# Patient Record
Sex: Female | Born: 1935 | Race: White | Hispanic: No | Smoking: Former smoker
Health system: Southern US, Community
[De-identification: ages and names within clinical notes are randomized; demographics above are authoritative.]

## PROBLEM LIST (undated history)

## (undated) DIAGNOSIS — B009 Herpesviral infection, unspecified: Secondary | ICD-10-CM

## (undated) DIAGNOSIS — G4700 Insomnia, unspecified: Secondary | ICD-10-CM

## (undated) DIAGNOSIS — T7840XA Allergy, unspecified, initial encounter: Secondary | ICD-10-CM

## (undated) DIAGNOSIS — E7800 Pure hypercholesterolemia, unspecified: Secondary | ICD-10-CM

## (undated) DIAGNOSIS — I10 Essential (primary) hypertension: Secondary | ICD-10-CM

## (undated) DIAGNOSIS — R739 Hyperglycemia, unspecified: Secondary | ICD-10-CM

## (undated) DIAGNOSIS — F419 Anxiety disorder, unspecified: Secondary | ICD-10-CM

## (undated) DIAGNOSIS — R7303 Prediabetes: Secondary | ICD-10-CM

## (undated) DIAGNOSIS — H3530 Unspecified macular degeneration: Secondary | ICD-10-CM

## (undated) HISTORY — DX: Insomnia, unspecified: G47.00

## (undated) HISTORY — DX: Allergy, unspecified, initial encounter: T78.40XA

## (undated) HISTORY — DX: Herpesviral infection, unspecified: B00.9

## (undated) HISTORY — DX: Hyperglycemia, unspecified: R73.9

## (undated) HISTORY — DX: Essential (primary) hypertension: I10

## (undated) HISTORY — DX: Anxiety disorder, unspecified: F41.9

## (undated) HISTORY — DX: Pure hypercholesterolemia, unspecified: E78.00

## (undated) HISTORY — PX: CATARACT EXTRACTION: SUR2

## (undated) HISTORY — PX: ABDOMINAL HYSTERECTOMY: SHX81

## (undated) HISTORY — PX: HEEL SPUR SURGERY: SHX665

---

## 1995-01-21 ENCOUNTER — Encounter: Payer: Self-pay | Admitting: Internal Medicine

## 1998-08-23 ENCOUNTER — Other Ambulatory Visit: Admission: RE | Admit: 1998-08-23 | Discharge: 1998-08-23 | Payer: Self-pay | Admitting: Obstetrics and Gynecology

## 1999-11-22 ENCOUNTER — Other Ambulatory Visit: Admission: RE | Admit: 1999-11-22 | Discharge: 1999-11-22 | Payer: Self-pay | Admitting: Obstetrics and Gynecology

## 1999-12-31 ENCOUNTER — Ambulatory Visit (HOSPITAL_COMMUNITY): Admission: RE | Admit: 1999-12-31 | Discharge: 1999-12-31 | Payer: Self-pay | Admitting: Gastroenterology

## 2000-11-27 ENCOUNTER — Ambulatory Visit (HOSPITAL_BASED_OUTPATIENT_CLINIC_OR_DEPARTMENT_OTHER): Admission: RE | Admit: 2000-11-27 | Discharge: 2000-11-27 | Payer: Self-pay | Admitting: Orthopedic Surgery

## 2000-12-01 ENCOUNTER — Ambulatory Visit (HOSPITAL_COMMUNITY): Admission: RE | Admit: 2000-12-01 | Discharge: 2000-12-01 | Payer: Self-pay | Admitting: Orthopedic Surgery

## 2000-12-02 ENCOUNTER — Ambulatory Visit (HOSPITAL_BASED_OUTPATIENT_CLINIC_OR_DEPARTMENT_OTHER): Admission: RE | Admit: 2000-12-02 | Discharge: 2000-12-02 | Payer: Self-pay | Admitting: Orthopedic Surgery

## 2001-01-01 ENCOUNTER — Other Ambulatory Visit: Admission: RE | Admit: 2001-01-01 | Discharge: 2001-01-01 | Payer: Self-pay | Admitting: Obstetrics and Gynecology

## 2002-05-19 ENCOUNTER — Ambulatory Visit (HOSPITAL_BASED_OUTPATIENT_CLINIC_OR_DEPARTMENT_OTHER): Admission: RE | Admit: 2002-05-19 | Discharge: 2002-05-19 | Payer: Self-pay | Admitting: Urology

## 2007-12-28 ENCOUNTER — Encounter: Admission: RE | Admit: 2007-12-28 | Discharge: 2007-12-28 | Payer: Self-pay | Admitting: Orthopedic Surgery

## 2007-12-31 ENCOUNTER — Ambulatory Visit (HOSPITAL_BASED_OUTPATIENT_CLINIC_OR_DEPARTMENT_OTHER): Admission: RE | Admit: 2007-12-31 | Discharge: 2007-12-31 | Payer: Self-pay | Admitting: Orthopedic Surgery

## 2008-09-04 ENCOUNTER — Encounter: Payer: Self-pay | Admitting: Internal Medicine

## 2008-09-14 ENCOUNTER — Encounter: Payer: Self-pay | Admitting: Internal Medicine

## 2008-11-14 ENCOUNTER — Encounter: Payer: Self-pay | Admitting: Emergency Medicine

## 2008-11-14 ENCOUNTER — Inpatient Hospital Stay (HOSPITAL_COMMUNITY): Admission: AD | Admit: 2008-11-14 | Discharge: 2008-11-18 | Payer: Self-pay | Admitting: Internal Medicine

## 2008-11-14 ENCOUNTER — Ambulatory Visit: Payer: Self-pay | Admitting: Internal Medicine

## 2008-11-14 ENCOUNTER — Ambulatory Visit: Payer: Self-pay | Admitting: Radiology

## 2008-11-15 ENCOUNTER — Encounter: Payer: Self-pay | Admitting: Internal Medicine

## 2008-11-18 ENCOUNTER — Encounter: Payer: Self-pay | Admitting: Internal Medicine

## 2008-11-21 ENCOUNTER — Ambulatory Visit: Payer: Self-pay | Admitting: Internal Medicine

## 2008-11-22 LAB — CONVERTED CEMR LAB
Basophils Absolute: 0.1 10*3/uL (ref 0.0–0.1)
Basophils Relative: 1.5 % (ref 0.0–3.0)
Eosinophils Relative: 7.1 % — ABNORMAL HIGH (ref 0.0–5.0)
Hemoglobin: 9.2 g/dL — ABNORMAL LOW (ref 12.0–15.0)
Lymphocytes Relative: 25.4 % (ref 12.0–46.0)
MCHC: 34.3 g/dL (ref 30.0–36.0)
Neutro Abs: 5 10*3/uL (ref 1.4–7.7)
Neutrophils Relative %: 59.4 % (ref 43.0–77.0)
RBC: 2.94 M/uL — ABNORMAL LOW (ref 3.87–5.11)
WBC: 8.3 10*3/uL (ref 4.5–10.5)

## 2008-12-12 ENCOUNTER — Ambulatory Visit: Payer: Self-pay | Admitting: Internal Medicine

## 2008-12-12 DIAGNOSIS — K260 Acute duodenal ulcer with hemorrhage: Secondary | ICD-10-CM | POA: Insufficient documentation

## 2008-12-12 DIAGNOSIS — D62 Acute posthemorrhagic anemia: Secondary | ICD-10-CM | POA: Insufficient documentation

## 2008-12-13 LAB — CONVERTED CEMR LAB
Basophils Absolute: 0.1 10*3/uL (ref 0.0–0.1)
Eosinophils Absolute: 0.4 10*3/uL (ref 0.0–0.7)
Ferritin: 22.9 ng/mL (ref 10.0–291.0)
HCT: 34.1 % — ABNORMAL LOW (ref 36.0–46.0)
Hemoglobin: 11.8 g/dL — ABNORMAL LOW (ref 12.0–15.0)
MCHC: 34.7 g/dL (ref 30.0–36.0)
MCV: 87.2 fL (ref 78.0–100.0)
Monocytes Absolute: 0.4 10*3/uL (ref 0.1–1.0)
Neutro Abs: 2.3 10*3/uL (ref 1.4–7.7)
Platelets: 251 10*3/uL (ref 150–400)
RDW: 12.6 % (ref 11.5–14.6)

## 2008-12-31 IMAGING — CR DG CHEST 2V
2 series · 2 of 2 positions shown · non-contrast
Comparison: none

CLINICAL DATA: Preop respiratory exam for heel surgery.  No respiratory complaints.
 CHEST - 2 VIEWS:
 The heart size and mediastinal contours are within normal limits.  Both lungs are clear.  The visualized skeletal structures are unremarkable.

[w chest pa]
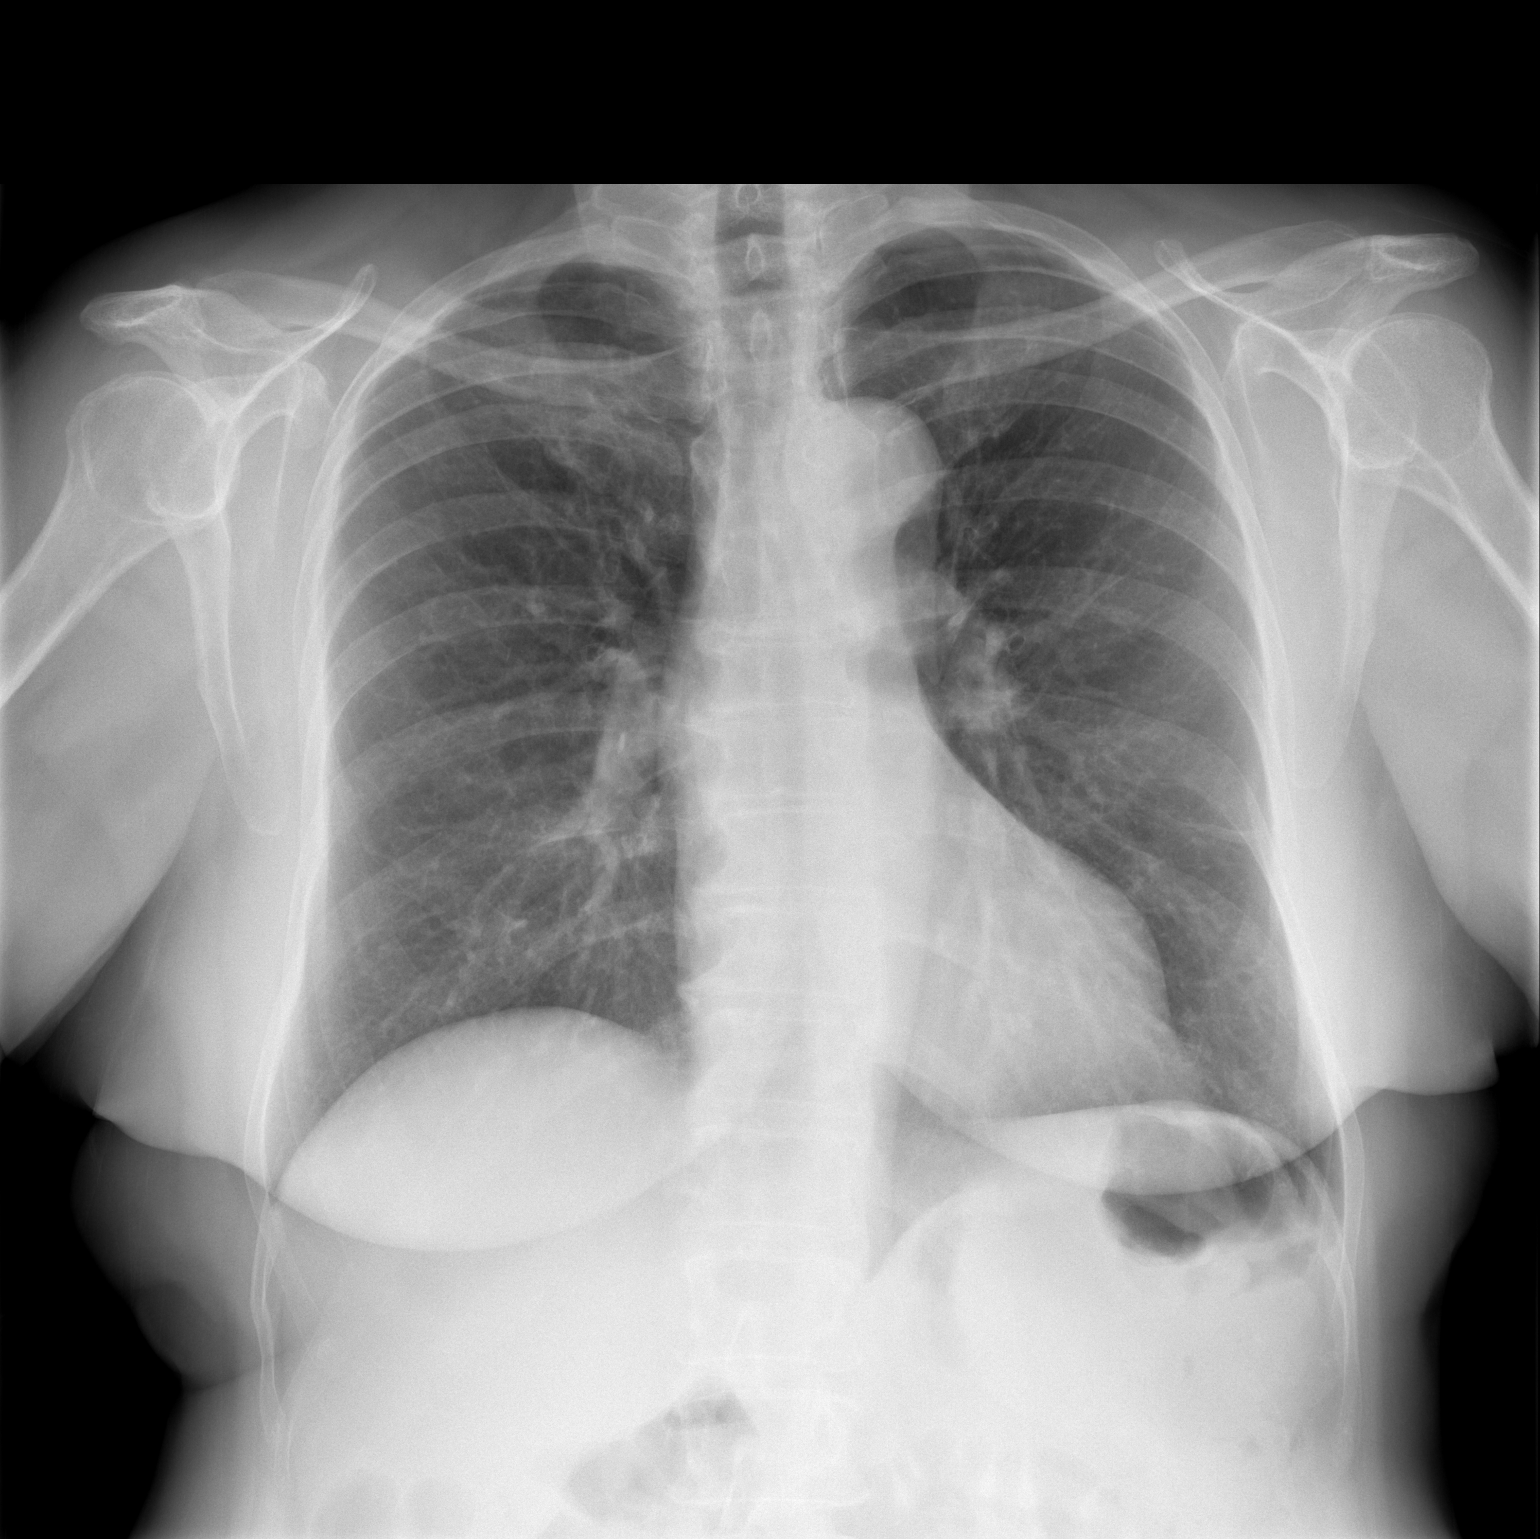

[w chest lat]
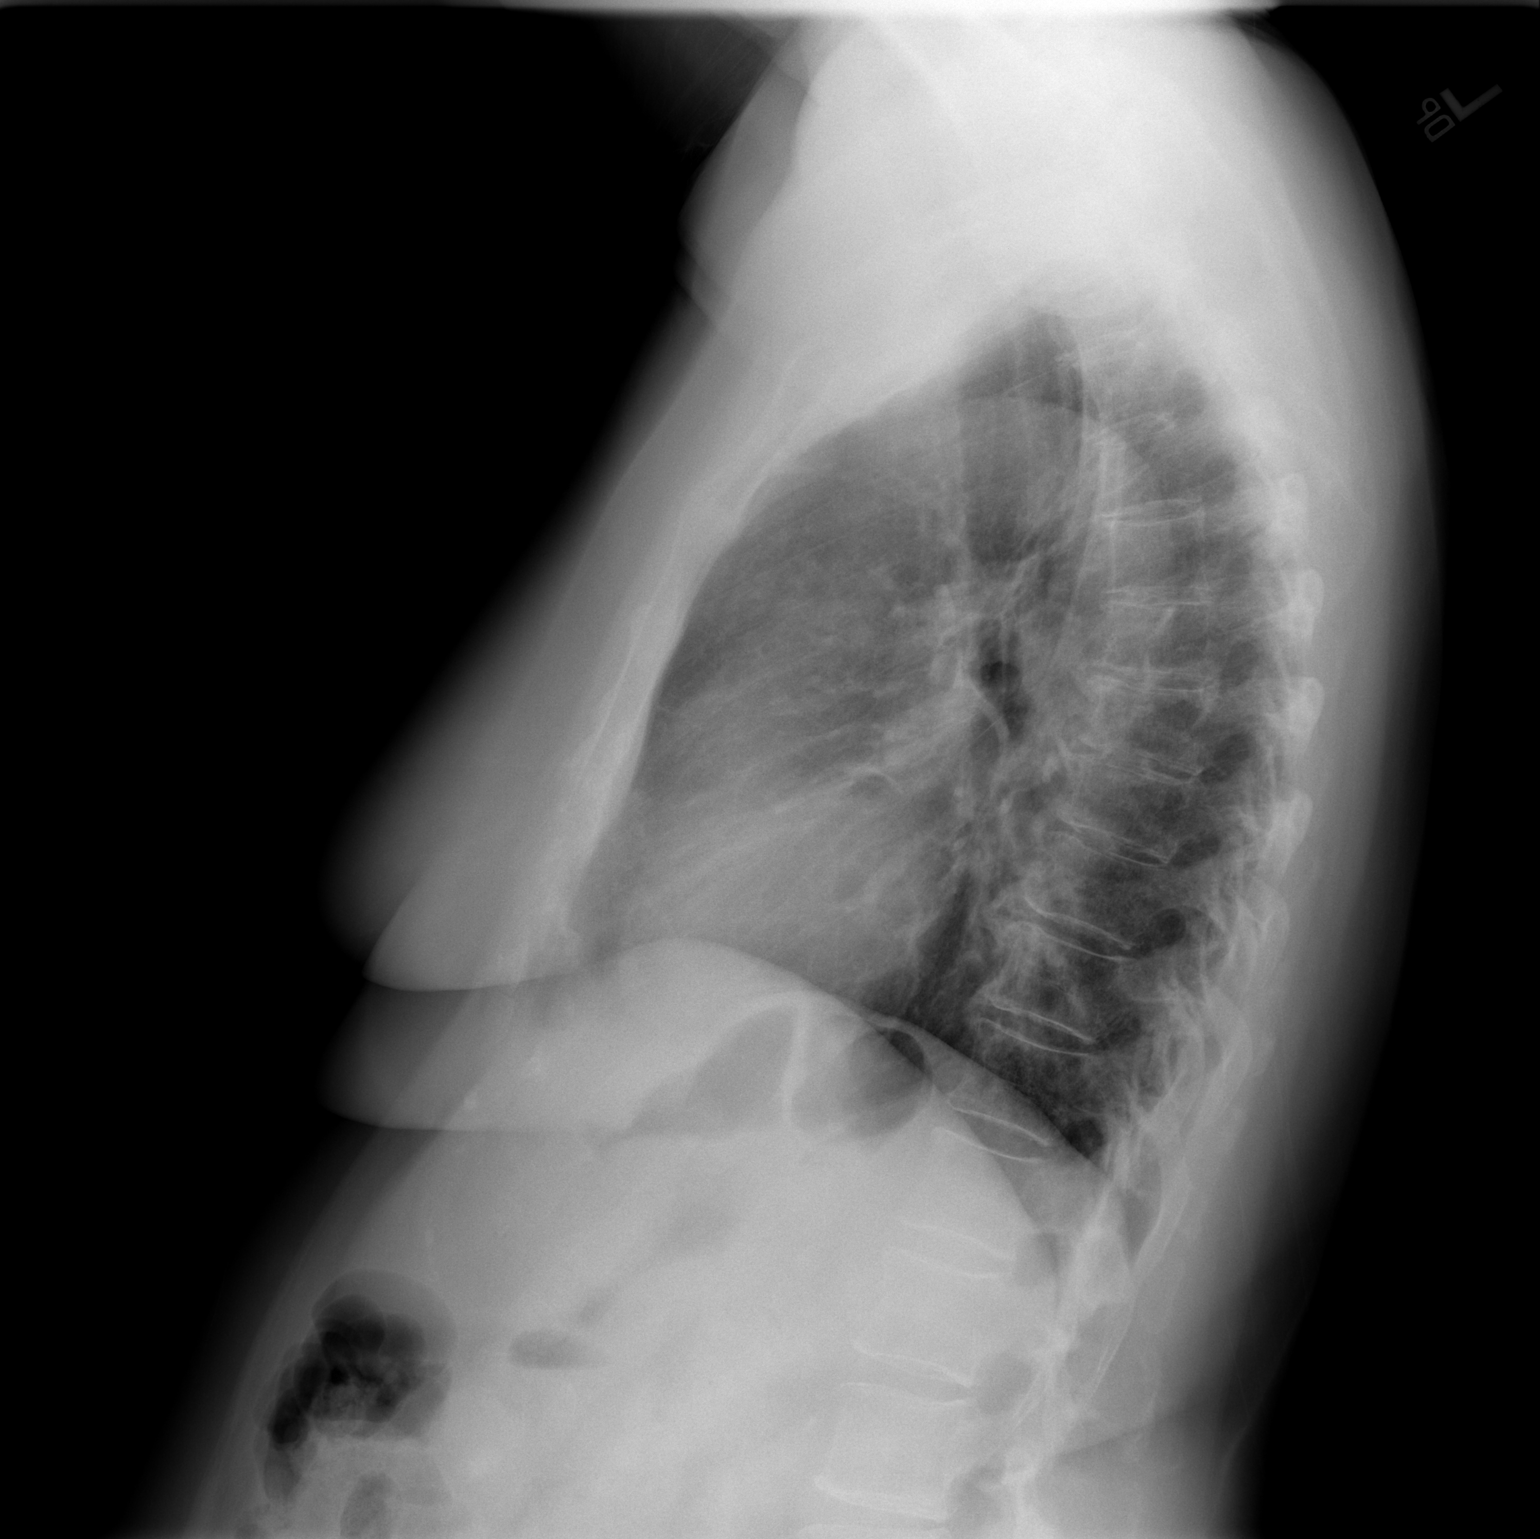

[2 of 2 positions shown; findings below may reference images not displayed]

IMPRESSION: No active cardiopulmonary disease.

## 2009-02-06 ENCOUNTER — Ambulatory Visit: Payer: Self-pay | Admitting: Internal Medicine

## 2009-02-07 LAB — CONVERTED CEMR LAB
Basophils Absolute: 0 10*3/uL (ref 0.0–0.1)
Eosinophils Relative: 8.5 % — ABNORMAL HIGH (ref 0.0–5.0)
Ferritin: 52.3 ng/mL (ref 10.0–291.0)
HCT: 39.7 % (ref 36.0–46.0)
Lymphocytes Relative: 38.2 % (ref 12.0–46.0)
Lymphs Abs: 1.9 10*3/uL (ref 0.7–4.0)
Monocytes Relative: 8.9 % (ref 3.0–12.0)
Neutrophils Relative %: 43.5 % (ref 43.0–77.0)
Platelets: 273 10*3/uL (ref 150.0–400.0)
RDW: 14.6 % (ref 11.5–14.6)
WBC: 5.1 10*3/uL (ref 4.5–10.5)

## 2009-11-18 IMAGING — CR DG CHEST 1V PORT
1 series · 1 of 1 positions shown · non-contrast
Comparison: 12/28/2007

CLINICAL DATA: Nausea and vomiting

PORTABLE CHEST - 1 VIEW

[view not recorded]
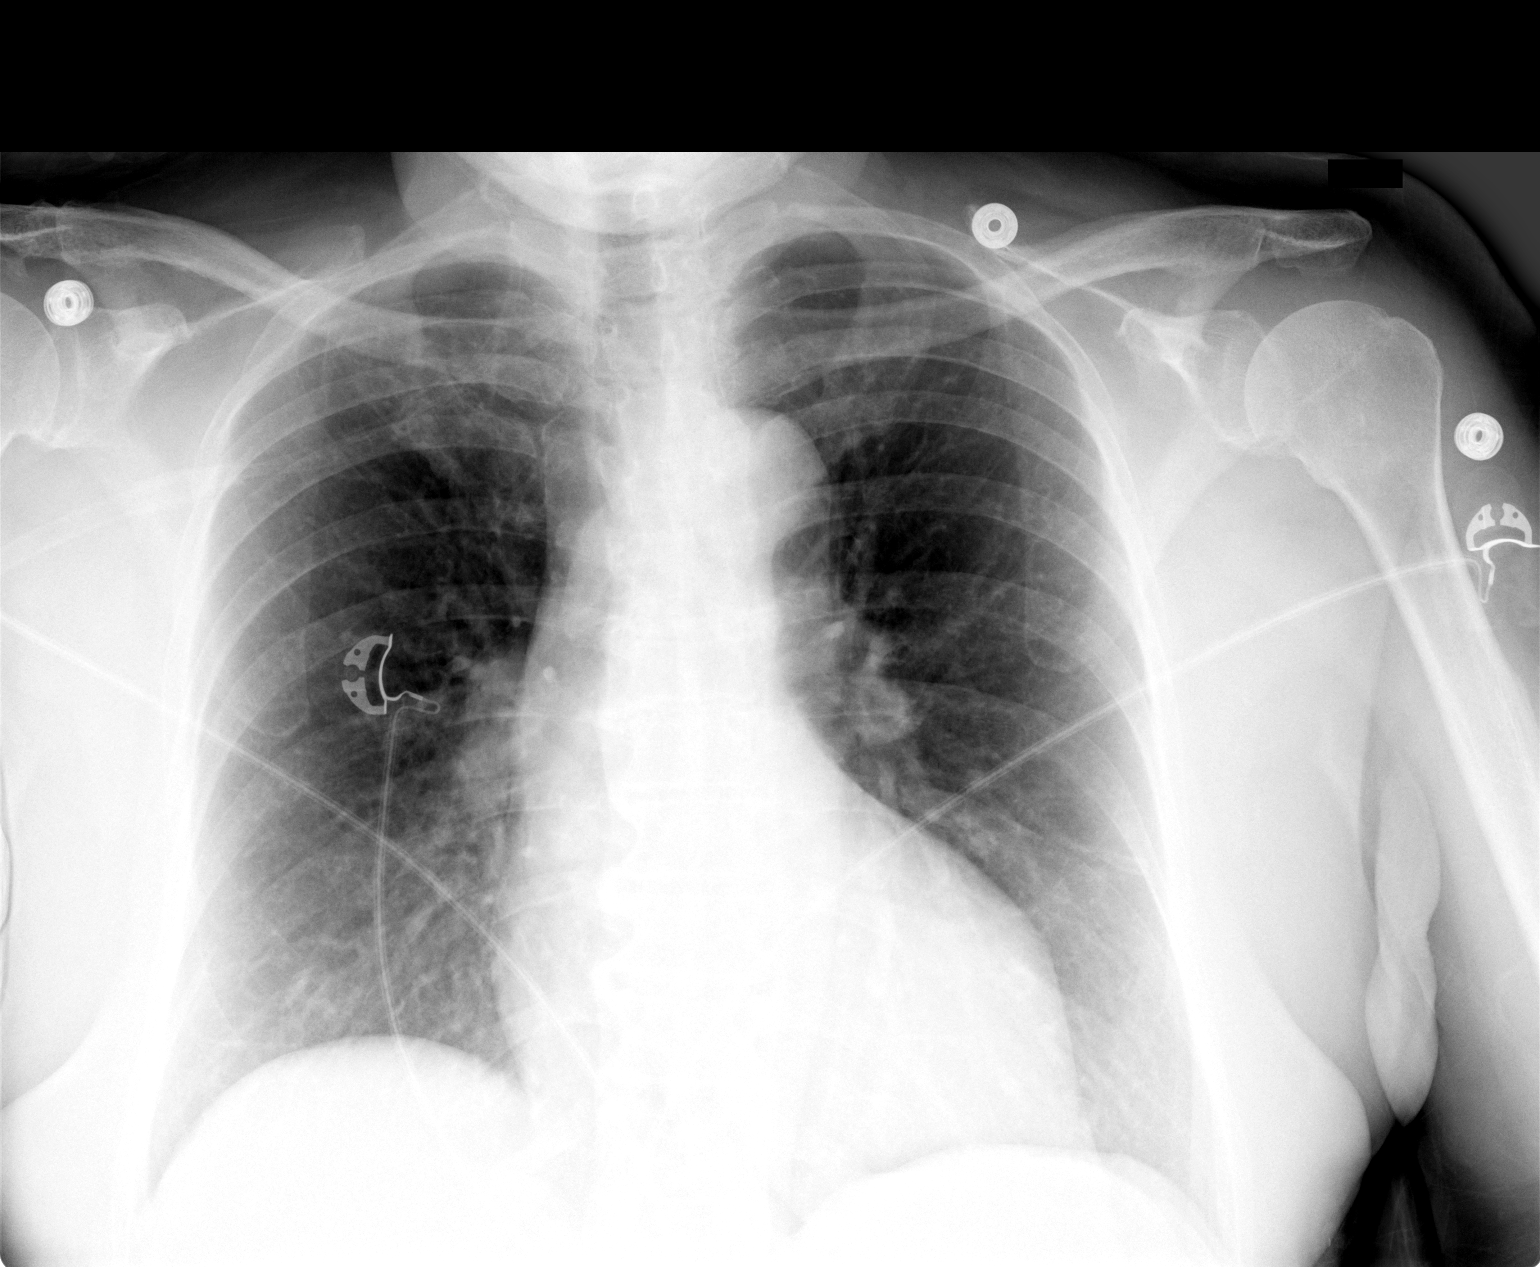

[1 of 1 positions shown; findings below may reference images not displayed]

FINDINGS: The heart size within normal limits for AP projection.
Aorta mildly tortuous.  No heart failure.  Lungs clear in one-view.
No pleural fluid.
IMPRESSION: No active disease.

## 2011-02-10 LAB — CBC
HCT: 24.5 % — ABNORMAL LOW (ref 36.0–46.0)
MCHC: 34.3 g/dL (ref 30.0–36.0)
MCHC: 35.3 g/dL (ref 30.0–36.0)
MCV: 89.6 fL (ref 78.0–100.0)
MCV: 92.1 fL (ref 78.0–100.0)
Platelets: 352 10*3/uL (ref 150–400)
RBC: 2.37 MIL/uL — ABNORMAL LOW (ref 3.87–5.11)
RBC: 2.5 MIL/uL — ABNORMAL LOW (ref 3.87–5.11)
RBC: 2.94 MIL/uL — ABNORMAL LOW (ref 3.87–5.11)
RDW: 11.8 % (ref 11.5–15.5)
RDW: 12 % (ref 11.5–15.5)
RDW: 13.4 % (ref 11.5–15.5)
WBC: 11.9 10*3/uL — ABNORMAL HIGH (ref 4.0–10.5)

## 2011-02-10 LAB — HEMOCCULT GUIAC POC 1CARD (OFFICE): Fecal Occult Bld: POSITIVE

## 2011-02-10 LAB — CROSSMATCH

## 2011-02-10 LAB — COMPREHENSIVE METABOLIC PANEL
Albumin: 3.4 g/dL — ABNORMAL LOW (ref 3.5–5.2)
BUN: 81 mg/dL — ABNORMAL HIGH (ref 6–23)
Calcium: 9.4 mg/dL (ref 8.4–10.5)
Glucose, Bld: 168 mg/dL — ABNORMAL HIGH (ref 70–99)
Total Protein: 6 g/dL (ref 6.0–8.3)

## 2011-02-10 LAB — DIFFERENTIAL
Basophils Relative: 0 % (ref 0–1)
Eosinophils Relative: 0 % (ref 0–5)
Lymphocytes Relative: 9 % — ABNORMAL LOW (ref 12–46)
Lymphs Abs: 1.8 10*3/uL (ref 0.7–4.0)
Monocytes Relative: 6 % (ref 3–12)
Monocytes Relative: 7 % (ref 3–12)
Neutro Abs: 11.3 10*3/uL — ABNORMAL HIGH (ref 1.7–7.7)
Neutrophils Relative %: 80 % — ABNORMAL HIGH (ref 43–77)
Neutrophils Relative %: 85 % — ABNORMAL HIGH (ref 43–77)

## 2011-02-10 LAB — BASIC METABOLIC PANEL
BUN: 20 mg/dL (ref 6–23)
BUN: 26 mg/dL — ABNORMAL HIGH (ref 6–23)
CO2: 22 mEq/L (ref 19–32)
CO2: 24 mEq/L (ref 19–32)
CO2: 25 mEq/L (ref 19–32)
Calcium: 8 mg/dL — ABNORMAL LOW (ref 8.4–10.5)
Calcium: 8.5 mg/dL (ref 8.4–10.5)
Chloride: 111 mEq/L (ref 96–112)
Chloride: 117 mEq/L — ABNORMAL HIGH (ref 96–112)
Creatinine, Ser: 0.61 mg/dL (ref 0.4–1.2)
Creatinine, Ser: 0.72 mg/dL (ref 0.4–1.2)
Creatinine, Ser: 0.75 mg/dL (ref 0.4–1.2)
GFR calc Af Amer: >60 mL/min (ref >60)
GFR calc Af Amer: >60 mL/min (ref >60)
Glucose, Bld: 89 mg/dL (ref 70–99)
Glucose, Bld: 92 mg/dL (ref 70–99)
Glucose, Bld: 98 mg/dL (ref 70–99)
Potassium: 3.7 mEq/L (ref 3.5–5.1)
Potassium: 4.2 mEq/L (ref 3.5–5.1)

## 2011-02-10 LAB — HEMOGLOBIN AND HEMATOCRIT, BLOOD
HCT: 24.6 % — ABNORMAL LOW (ref 36.0–46.0)
HCT: 28.4 % — ABNORMAL LOW (ref 36.0–46.0)
HCT: 29.9 % — ABNORMAL LOW (ref 36.0–46.0)
Hemoglobin: 10.2 g/dL — ABNORMAL LOW (ref 12.0–15.0)
Hemoglobin: 11.4 g/dL — ABNORMAL LOW (ref 12.0–15.0)

## 2011-02-10 LAB — URINE MICROSCOPIC-ADD ON

## 2011-02-10 LAB — LIPID PANEL
HDL: 21 mg/dL — ABNORMAL LOW (ref >39)
Triglycerides: 123 mg/dL (ref <150)

## 2011-02-10 LAB — URINE CULTURE

## 2011-02-10 LAB — CK: Total CK: 46 U/L (ref 7–177)

## 2011-02-10 LAB — CLOTEST (H. PYLORI), BIOPSY: Helicobacter screen: NEGATIVE

## 2011-02-10 LAB — MAGNESIUM: Magnesium: 2.1 mg/dL (ref 1.5–2.5)

## 2011-02-10 LAB — URINALYSIS, ROUTINE W REFLEX MICROSCOPIC
Bilirubin Urine: NEGATIVE
Nitrite: NEGATIVE
Specific Gravity, Urine: 1.017 (ref 1.005–1.030)
Urobilinogen, UA: 1 mg/dL (ref 0.0–1.0)

## 2011-02-10 LAB — TYPE AND SCREEN: Antibody Screen: NEGATIVE

## 2011-02-10 LAB — PROTIME-INR
INR: 1.3 (ref 0.00–1.49)
Prothrombin Time: 16.8 seconds — ABNORMAL HIGH (ref 11.6–15.2)

## 2011-03-11 NOTE — Discharge Summary (Signed)
Denise Strickland, Denise Strickland                 ACCOUNT NO.:  [id number]   MEDICAL RECORD NO.:  [id number]          PATIENT TYPE:  INP   LOCATION:  1222                         FACILITY:  [facility]   PHYSICIAN:  Michelene Gardener, MD    DATE OF BIRTH:  January 02, 1931   DATE OF ADMISSION:  11/14/2008  DATE OF DISCHARGE:  11/18/2008                               DISCHARGE SUMMARY   DISCHARGE DIAGNOSES:  1. Upper gastrointestinal bleed secondary to duodenal ulcer.  2. Acute post hemorrhagic anemia from gastrointestinal bleed.  3. Peptic ulcer disease.  4. Syncope secondary to gastrointestinal bleed.  5. Hypertension.  6. Hyperlipidemia.  7. Urinary tract infection with cultures positive for Escherichia      coli.   DISCHARGE MEDICATIONS:  New medications:  1. Protonix 40 mg p.o. twice daily.  2. Ciprofloxacin 500 mg p.o. twice daily x5 days.   Old medications:  1. Altace 5 mg p.o. once daily.  2. Hydrochlorothiazide 25 mg once a day.  3. Gemfibrozil 600 mg p.o. twice daily.  4. Phenergan 25 mg as needed.  5. Vitamin E.  6. Vitamin D.  7. Vitamin B6.  8. Vitamin B12.  9. Zinc.   CONSULTATIONS:  GI consult with Dr. Stan Head.   PROCEDURES:  EGD done on November 15, 2008 and it showed 2 cm ulcer in  the descending duodenum.   RADIOLOGY STUDIES:  Chest x-ray on January 19 showed no active disease.   FOLLOWUP:  1. Dr. Leone Payor in 1 - 2 weeks.  2. [facility] in 1 - 2 weeks.   COURSE OF HOSPITALIZATION:  1. Upper GI bleed.  This patient that presented with abdominal pain      and melena on January 19.  Hemoglobin at that time was 8.6.      Continued to watch hemoglobin and it dropped to 8.  The patient was      admitted to the intensive care unit for evaluation of a GI bleed.      The patient was transfused 2 units of packed RBCs.  Initially she      was kept NPO and was started on IV fluids.  A Protonix drip has      been started.  GI consult was done by [facility].  The  patient was      taken for upper endoscopy on January 20 and the scope showed large      duodenal ulcer around 2 cm.  Hemoglobin has been dropping a little      and patient has had another transfusion.  Patient continued to      improve and she does not have any active bleeding in the hospital.      Currently at the time of discharge she is asymptomatic, does not      have abdominal pain.  Last bowel movement was around 3 to 4 days      ago.  Her last hemoglobin was 9.6.  Patient will be discharged on      Protonix 40 mg twice daily and she needs to take that indefinitely  as recommended by GI.  She will be followed by [facility] in 1 - 2      weeks.  I advised her to either contact her doctor or come to the      ER if she saw blood coming with her stool or if she had any other      source of bleeding.  2. Acute post hemorrhagic anemia that was secondary to upper GI bleed      and patient was managed with a blood transfusion.  There is no      active bleeding during the hospitalization and hemoglobin is 9.6 at      the time of discharge.  3. Peptic ulcer disease, management of #1 and #2.  The patient will      need to be on Protonix 40 mg twice daily indefinitely as      recommended by GI.  4. Syncope that was secondary to GI bleed.  The patient was monitored      on telemetry with no evidence of arrhythmia.  There is no      recurrence of syncope during the hospitalization.  Blood pressure      remained stable following stabilization of her hemoglobin.  5. Urinary tract infection.  Urine culture was positive for E. coli.      The patient was started on Cipro IV during the hospitalization and      cultures are sensitive to Cipro.  The patient to be discharged on 5      days of ciprofloxacin.   DISPOSITION:  Otherwise other medical conditions remained stable in the  hospital.  The patient will be discharged today on all preadmission  medications in addition to Protonix, Cipro and  she will be followed by  her primary doctor and by the [facility].  Total assessment time is 40  minutes.      Michelene Gardener, MD  Electronically Signed     NAE/MEDQ  D:  11/18/2008  T:  11/18/2008  Job:  [id number]

## 2011-03-11 NOTE — Op Note (Signed)
NAME:  Denise Strickland, Denise Strickland              ACCOUNT NO.:  [id number]   MEDICAL RECORD NO.:  [id number]          PATIENT TYPE:  AMB   LOCATION:  DSC                          FACILITY:  [facility]   PHYSICIAN:  Harvie Junior, M.D.   DATE OF BIRTH:  1930/12/23   DATE OF PROCEDURE:  12/31/2007  DATE OF DISCHARGE:                               OPERATIVE REPORT   PREOPERATIVE DIAGNOSIS:  Haglund's deformity with calcified Achilles  tendinitis, left.   POSTOPERATIVE DIAGNOSIS:  Haglund's deformity with calcified Achilles  tendinitis, left.   PRINCIPLE PROCEDURE:  1. Ostectomy of the calcaneus by way of excising the Haglund's spur.  2. Tenolysis of the Achilles tendon by way of debridement of calcified      Achilles tendon.   SURGEON:  Harvie Junior, M.D.   ASSISTANT:  Marshia Ly, P.A.   ANESTHESIA:  General.   BRIEF HISTORY:  Denise Strickland is a 77 year old female with a long history  of having had significant, painful left heel.  We had done a right heel  about five years ago with a calcified Achilles tendinitis.  She did  great with that.  Because of continuing complaints of left heel pain  with activity after failure of conservative care and physical therapy,  anti-inflammatory medications, stretching, and icing, she was brought to  the operating room for excision of Haglund's and debridement of  calcified Achilles tendinitis.   PROCEDURE:  Patient was brought to the operating room.  After adequate  anesthesia was obtained with general anesthetic, patient was brought to  the operating room table.  She was then placed supine on a bed, then she  was rolled into the prone position.  All bony prominences were well  padded, and care was taken in the axillary folds and chest rolls and  padded at the knees and heels.   At this point, the left leg was prepped and draped in the usual sterile  fashion.  The leg was exsanguinated and blood pressure insufflated to  300 mmHg.  Following this, an  incision was made just lateral to the  Achilles tendon.  The subcutaneous tissue was taken down to the level of  the Achilles tendon.  The Achilles tendon was identified.  The  retrocalcaneal bursa was excised.  The Haglund's deformity was easily  identifiable and excised with an osteotome.  Following this,  fluoroscopic imaging was used, and we were able to identify the location  of the calcified Achilles tendinitis.  This was debrided.  The calcified  Achilles tendinitis was debrided with a combination of curettes and  rongeurs.  The coronal one-third of the anterior aspect of the Achilles  tendon was debrided.  At this point, there was no remaining calcified  feel to the Achilles tendon.  The excursion of the Achilles tendon was  well.  The insertion was essentially not disrupted at all.  At this  point, the wound was copiously and thoroughly irrigated and suctioned  dry.  The exposed ostectomy of the calcaneus was covered with bone wax,  a thin layer, and this was removed.  The wound was  again irrigated.  Following this, the deep layer was closed with no Vicryl running, the  skin with 3-0  Vicryl interrupted x3, and then a running 4-0 nylon sterile compressive  dressing was applied as well as a planar-flexed plaster splint.  The  patient was taken to the recovery room.  She was noted to be in  satisfactory condition.  Estimated blood loss for the procedure was  none.      Harvie Junior, M.D.  Electronically Signed     JLG/MEDQ  D:  12/31/2007  T:  12/31/2007  Job:  [id number]

## 2011-03-11 NOTE — H&P (Signed)
Denise Strickland, Denise Strickland                 ACCOUNT NO.:  [id number]   MEDICAL RECORD NO.:  [id number]          PATIENT TYPE:  INP   LOCATION:  1222                         [facility]ACILITY:  [facility]   PHYSICIAN:  Peggye Pitt, M.D. DATE OF BIRTH:  Jan 27, 1931   DATE OF ADMISSION:  11/14/2008  DATE OF DISCHARGE:                              HISTORY & PHYSICAL   PRIMARY CARE PHYSICIAN:  Production assistant, radio.   CHIEF COMPLAINT:  Syncope, melena, nausea, vomiting.   HISTORY OF PRESENT ILLNESS:  Denise Strickland is a very pleasant 77 year old  Caucasian woman who appears much younger than her age, who states that  for the past four days she has been having some nausea and vomiting,  about two to three episodes a day consisting of brown fluid.  This  morning  she stood up from a chair, suddenly became quite dizzy and then  blacked out, lost consciousness and fell to the floor.  The next thing  she remembers is opening her eyes and a trying to get off floor and  noticing that as she was covered in black stool.  She called her son who  immediately brought her to the [facility] emergency  department for further evaluation.  There, she was found to have a  hemoglobin of 7.5 and we are consulted for further evaluation and  management.   ALLERGIES:  She has no allergies.  However, she does state an  intolerance to NSAIDS and aspirin both of which causes stomach ulcers.   PAST MEDICAL HISTORY:  Significant for:  1. History of peptic ulcer disease.  2. Hypertension.  3. Hyperlipidemia.   MEDICATIONS:  Her home medications include:  1. Altace 5 mg daily.  2. Hydrochlorothiazide 25 mg daily.  3. Gemfibrozil 600 mg twice a day, as well as a variety of vitamins.   SOCIAL HISTORY:  She is widowed, has strong family support.  She quit  smoking at the age of 70.  She denies any alcohol or illicit drug use.   FAMILY HISTORY:  Noncontributory.   REVIEW OF SYSTEMS:  Negative except as already  mentioned on HPI.   PHYSICAL EXAMINATION:  VITAL SIGNS UPON ADMISSION:  Blood pressure  131/66, heart rate 90, respirations 16, O2 sats 100% on room air with a  temperature of 98.2.  GENERAL:  She is alert, awake, oriented x3, does not appear to be in any  distress.  HEENT: Normocephalic, atraumatic.  Her pupils are equally reactive to  light and accommodation with intact extraocular movements.  NECK:  Supple.  No JVD, no lymphadenopathy, no bruits, no goiter.  LUNGS:  Clear to auscultation bilaterally.  HEART:  Regular rate and rhythm with no murmurs, rubs or gallops.  ABDOMEN:  Soft, nontender, nondistended with positive bowel sounds.  EXTREMITIES:  No edema.  Positive pedal pulses.  NEUROLOGIC:  Exam is grossly intact and nonfocal.   Her labs upon admission show a sodium of 141, potassium 3.3, chloride  104, bicarb 23, BUN 81, creatinine 1.1 and glucose of 168.  All of her  LFTs are within normal limits  with the exception of an albumin of 3.4.  WBCs 18.9, hemoglobin of 7.5 and platelets of 352.  She had an FOBT that  was positive and of INR of 1.3, a CK of 46, a urinalysis that shows 21-  50 WBCs with 7-10 RBCs and few bacteria.  A chest x-ray shows no acute  disease.   ASSESSMENT AND PLAN:  1. Syncopal event which is likely secondary to a combination of      dehydration plus her low hemoglobin.  Please see below for details.  2. Melena.  She does have a history of peptic ulcer disease.  I      believe at this point in time that this is the most likely      diagnosis.  We will place on b.i.d. PPI.  We will hydrate with IV      fluids.  Will type and cross.  She has already been transfused 1      unit of PRBCs.  We will cycle her CBCs every 8 hours for the first      24 hours.  I have already consulted  GI for a possible EGD.      She will be kept n.p.o.  3. Urinary tract infection.  Will check a culture and start her on      Cipro.  4. Leukocytosis which is secondary to  her UTI.  We will follow with      treatment of it.  5. Hypokalemia which is likely secondary to her vomiting.  Will      replete IV.  6. Hypertension.  Given her ongoing GI bleed and her n.p.o. status,      will hold her antihypertensives at this time and monitor.  7. Hyperlipidemia.  We will hold her gemfibrozil at this time and      check a fasting lipid panel.  8. For prophylaxis while in the hospital, she will be on Protonix for      GI prophylaxis and on compression devices for DVT prophylaxis.      Peggye Pitt, M.D.  Electronically Signed     EH/MEDQ  D:  11/14/2008  T:  11/14/2008  Job:  [id number]

## 2011-03-11 NOTE — Discharge Summary (Signed)
[name], Denise Strickland                 ACCOUNT NO.:  [id number]   MEDICAL RECORD NO.:  [id number]          PATIENT TYPE:  INP   LOCATION:  1222                         FACILITY:  [facility]   PHYSICIAN:  Michelene Gardener, MD    DATE OF BIRTH:  1931-09-03   DATE OF ADMISSION:  11/14/2008  DATE OF DISCHARGE:  11/18/2008                               DISCHARGE SUMMARY   DISCHARGE DIAGNOSES:  1. Upper gastrointestinal bleed secondary to duodenal ulcer.  2. Acute post-hemorrhagic anemia secondary to number 1.  3. Peptic ulcer disease.  4. Syncope secondary to gastrointestinal bleed.  5. Hypertension.  6. Urinary tract infection with cultures positive for Escherichia      coli.  7. Hyperlipidemia.   DISCHARGE MEDICATIONS:  Protonix 40 mg p.o. twice daily.   Dictation Ends Here      Michelene Gardener, MD  Electronically Signed     NAE/MEDQ  D:  11/18/2008  T:  11/18/2008  Job:  [id number]

## 2011-07-21 LAB — POCT HEMOGLOBIN-HEMACUE: Hemoglobin: 13.7

## 2011-07-21 LAB — BASIC METABOLIC PANEL
BUN: 14
Chloride: 107
Potassium: 3.8
Sodium: 140

## 2012-03-17 DIAGNOSIS — I10 Essential (primary) hypertension: Secondary | ICD-10-CM | POA: Insufficient documentation

## 2015-12-19 DIAGNOSIS — E781 Pure hyperglyceridemia: Secondary | ICD-10-CM | POA: Insufficient documentation

## 2016-06-19 ENCOUNTER — Encounter: Payer: Self-pay | Admitting: Internal Medicine

## 2016-09-02 ENCOUNTER — Ambulatory Visit: Payer: Self-pay | Admitting: Internal Medicine

## 2016-09-26 ENCOUNTER — Ambulatory Visit: Payer: Self-pay | Admitting: Internal Medicine

## 2016-09-30 ENCOUNTER — Ambulatory Visit: Payer: Self-pay | Admitting: Internal Medicine

## 2019-06-21 DIAGNOSIS — G4700 Insomnia, unspecified: Secondary | ICD-10-CM | POA: Insufficient documentation

## 2019-06-21 DIAGNOSIS — R7301 Impaired fasting glucose: Secondary | ICD-10-CM | POA: Insufficient documentation

## 2020-01-26 ENCOUNTER — Encounter: Payer: Self-pay | Admitting: *Deleted

## 2020-01-26 DIAGNOSIS — Z170 Estrogen receptor positive status [ER+]: Secondary | ICD-10-CM | POA: Insufficient documentation

## 2020-01-26 DIAGNOSIS — C50412 Malignant neoplasm of upper-outer quadrant of left female breast: Secondary | ICD-10-CM

## 2020-01-31 ENCOUNTER — Encounter: Payer: Self-pay | Admitting: Oncology

## 2020-01-31 NOTE — Progress Notes (Signed)
Radiation Oncology         [phone] ________________________________  Name: Denise Strickland        MRN: [id number]  Date of Service: 02/01/2020 DOB: May 10, 1931   REFERRING PHYSICIAN: Dr. Georgette Dover  DIAGNOSIS: The encounter diagnosis was Malignant neoplasm of upper-outer quadrant of left breast in female, estrogen receptor positive ([place]).   HISTORY OF PRESENT ILLNESS: Denise Strickland is a 86 y.o. female seen in the multidisciplinary breast clinic for a new diagnosis of left breast cancer. The patient was noted to have a screening detected abnormality in the left breast on 12/28/2019, there was a possible mass seen in the central posterior depth of the left breast, subsequent diagnostic ultrasound on 01/12/2020 revealed a 1.1 cm round mass in the left breast at approximately 3:00, and her axilla was negative for adenopathy.  She underwent a biopsy that revealed a grade 1, invasive ductal carcinoma that was ER/PR positive, HER2 negative with a  Ki 67 of 5%. she comes today to discuss treatment recommendations for her cancer.   PREVIOUS RADIATION THERAPY: No   PAST MEDICAL HISTORY:  Past Medical History:  Diagnosis Date  . Allergy   . Anxiety   . Diabetes ([employer])   . Herpes simplex    from [facility] chart  . Hypercholesterolemia    from [facility] chart  . Hyperglycemia    from [facility] chart  . Hypertension   . Insomnia    from [facility] chart       PAST SURGICAL HISTORY:  Past Surgical History:  Procedure Laterality Date  . ABDOMINAL HYSTERECTOMY    . CATARACT EXTRACTION     from [facility] chart  . HEEL SPUR SURGERY Bilateral    from [facility] chart      FAMILY HISTORY:  Family History  Problem Relation Age of Onset  . Colon cancer Mother   . Breast cancer Sister 1  . Asthma Brother      SOCIAL HISTORY:  reports that she quit smoking about 58 years ago. She does not have any smokeless tobacco history on file. She reports previous alcohol use. She reports that she does not use drugs.  The  patient is widowed and lives in [place]. She is accompanied by her grand daughter. 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 She enjoys fishing.   ALLERGIES: Aspirin, Indomethacin, Other, and Pantoprazole   MEDICATIONS:  Current Outpatient Medications  Medication Sig Dispense Refill  . ALPRAZolam (XANAX) 0.5 MG tablet     . gemfibrozil (LOPID) 600 MG tablet     . hydrochlorothiazide (HYDRODIURIL) 25 MG tablet TAKE 1 TABLET EVERY DAY    . metoprolol tartrate (LOPRESSOR) 25 MG tablet Take 25 mg by mouth 2 (two) times daily.    . mirtazapine (REMERON) 7.5 MG tablet     . ramipril (ALTACE) 10 MG capsule      No current facility-administered medications for this encounter.     REVIEW OF SYSTEMS: On review of systems, the patient reports that she is doing well overall. She denies any chest pain, shortness of breath, cough, fevers, chills, night sweats, unintended weight changes. She denies any bowel or bladder disturbances, and denies abdominal pain, nausea or vomiting. She denies any new musculoskeletal or joint aches or pains. A complete review of systems is obtained and is otherwise negative.     PHYSICAL EXAM:  Wt Readings from Last 3 Encounters:  02/01/20 160 lb 12.8 oz (72.9 kg)   Temp Readings from Last 3 Encounters:  02/01/20 98.5 F (36.9 C) (Temporal)   BP Readings from Last 3 Encounters:  02/01/20 (!) 167/75   Pulse Readings from Last 3 Encounters:  02/01/20 (!) 54    In general this is a well appearing caucasian female in no acute distress. She's alert and oriented x4 and appropriate throughout the examination. Cardiopulmonary assessment is negative for acute distress and she exhibits normal effort. Bilateral breast exam is deferred.    ECOG = 0  0 - Asymptomatic (Fully active, able to carry on all predisease activities without restriction)  1 - Symptomatic but completely ambulatory (Restricted in physically strenuous activity but ambulatory and able to carry out  work of a light or sedentary nature. For example, light housework, office work)  2 - Symptomatic, <50% in bed during the day (Ambulatory and capable of all self care but unable to carry out any work activities. Up and about more than 50% of waking hours)  3 - Symptomatic, >50% in bed, but not bedbound (Capable of only limited self-care, confined to bed or chair 50% or more of waking hours)  4 - Bedbound (Completely disabled. Cannot carry on any self-care. Totally confined to bed or chair)  5 - Death   Eustace Pen MM, Creech RH, Tormey DC, et al. [phone]). "Toxicity and response criteria of the [facility]". [place] Oncol. 5 (6): 649-55    LABORATORY DATA:  Lab Results  Component Value Date   WBC 7.0 02/01/2020   HGB 14.0 02/01/2020   HCT 39.3 02/01/2020   MCV 90.3 02/01/2020   PLT 295 02/01/2020   Lab Results  Component Value Date   NA 136 02/01/2020   K 3.9 02/01/2020   CL 101 02/01/2020   CO2 26 02/01/2020   Lab Results  Component Value Date   ALT 19 02/01/2020   AST 24 02/01/2020   ALKPHOS 48 02/01/2020   BILITOT 0.8 02/01/2020      RADIOGRAPHY: No results found.     IMPRESSION/PLAN: 1. Stage IA, [id number] grade 1, ER/PR positive invasive ductal carcinoma of the left breast. Dr. [name] discusses the pathology findings and reviews the nature of left breast disease. The consensus from the breast conference includes breast conservation with lumpectomy. She has favorable features to possibly avoid radiotherapy, but we did discuss options to pursue external radiotherapy to the breast followed by antiestrogen therapy. We discussed the risks, benefits, short, and long term effects of radiotherapy, and the patient may be interested in proceeding. Dr. [name] discusses the delivery and logistics of radiotherapy and anticipates a course of 4 weeks of radiotherapy. We will see her back about 2 weeks after surgery to discuss treatment and the options to proceed with  the simulation process and anticipate we starting radiotherapy about 4-6 weeks after surgery.    In a visit lasting 45 minutes, greater than 50% of the time was spent face to face reviewing her case, as well as in preparation of, discussing, and coordinating the patient's care.  The above documentation reflects my direct findings during this shared patient visit. Please see the separate note by Dr. [name] on this date for the remainder of the patient's plan of care.    Carola Rhine, PAC

## 2020-01-31 NOTE — Progress Notes (Signed)
[place]  [phone] [phone]     ID: Denise Strickland DOB: 03-17-1931  MR#: [id number]  XNT#:[id number]  Patient Care Team: [name], PA-C as PCP - General (Family Medicine) Mauro Kaufmann, RN as Oncology Nurse Navigator Rockwell Germany, RN as Oncology Nurse Navigator Donnie Mesa, MD as Consulting Physician (General Surgery) , Virgie Dad, MD as Consulting Physician (Oncology) Kyung Rudd, MD as Consulting Physician (Radiation Oncology) Chauncey Cruel, MD OTHER MD:  CHIEF COMPLAINT: Estrogen receptor positive breast cancer  CURRENT TREATMENT: Awaiting definitive surgery.   HISTORY OF CURRENT ILLNESS: Denise Strickland had routine screening mammography on 12/28/2019 showing a possible abnormality in the left breast. She underwent left diagnostic mammography with tomography and left breast ultrasonography at [facility] on 01/12/2020 showing: breast density category B; 1.1 cm round mass in the left breast at 3 o'clock; no significant left axilla abnormalities.  Accordingly on 01/23/2020 she proceeded to biopsy of the left breast area in question. The pathology from this procedure ([id number]) showed: invasive ductal carcinoma, grade 1. Prognostic indicators significant for: estrogen receptor, 100% positive and progesterone receptor, 70% positive, both with strong staining intensity. Proliferation marker Ki67 at 5%. HER2 negative by immunohistochemistry (1+).  The patient's subsequent history is as detailed below.   INTERVAL HISTORY: Denise Strickland was evaluated in the multidisciplinary breast cancer clinic on 02/01/2020 accompanied by her granddaughter Denise Strickland [facility] daughter). Her case was also presented at the multidisciplinary breast cancer conference on the same day. At that time a preliminary plan was proposed: Left lumpectomy with sentinel lymph node sampling, no Oncotype unless lymph node positive, likely no chemotherapy, adjuvant radiation,  antiestrogens   REVIEW OF SYSTEMS: On the provided questionnaire, Meegan reports wearing glasses and hearing aids, history of stomach ulcer in 2010, history of UTI in 2015, forgetfulness, anxiety, phobias, and diabetes. There were no specific symptoms leading to the original mammogram, which was routinely scheduled. The patient denies unusual headaches, visual changes, nausea, vomiting, stiff neck, dizziness, or gait imbalance. There has been no cough, phlegm production, or pleurisy, no chest pain or pressure, and no change in bowel or bladder habits. The patient denies fever, rash, bleeding, unexplained fatigue or unexplained weight loss.  She exercises chiefly by walking usually about a mile at a time and she also has a treadmill at home that she uses 30 minutes at a time.  A detailed review of systems was otherwise entirely negative.   PAST MEDICAL HISTORY: Past Medical History:  Diagnosis Date   Allergy    Anxiety    Diabetes ([place])    Herpes simplex    from [facility] chart   Hypercholesterolemia    from [facility] chart   Hyperglycemia    from [facility] chart   Hypertension    Insomnia    from [facility] chart     PAST SURGICAL HISTORY: The patient is status post hysterectomy and unilateral salpingo-oophorectomy, is a remote right arm cyst removal, has had Achilles tendon surgery on both heels, is status post urethral support surgery under Dr. Idamae Schuller  FAMILY HISTORY: Family History  Problem Relation Age of Onset   Colon cancer Mother    Breast cancer Sister 73   Asthma Brother    Her mother was diagnosed with colon cancer at age 32 and lived to age 23. Her father died at age 46 from pneumonia.  The patient had 3 brothers and 3 sisters.  1 sister was diagnosed with breast cancer in her 70's and  died at age 88.  There is no history of prostate ovarian or pancreatic cancer in the family to the patient's knowledge   GYNECOLOGIC HISTORY:  No LMP recorded. Menarche: 86 years old Age at first  live birth: 86 years old [place] P 2 LMP 1973 Contraceptive: never used HRT never used  Hysterectomy? Yes, 1973 BSO?  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 Husband Denise Strickland died in 1999/12/21; he had also worked for [employer].  Son Denise Strickland, age 31, runs the [employer] in [place], [place].  His daughter and Denise Strickland is an Engineering geologist at [employer].  Son Denise Strickland "Randall Hiss," age 21, runs a different trucking company.  Denise Strickland has 4 grandchildren and 3 great-grandchildren. She attends a [employer] in [place].    ADVANCED DIRECTIVES: In place; sons Denise Strickland and Randall Hiss together are her HCPOA.   HEALTH MAINTENANCE: Social History   Tobacco Use   Smoking status: Former Smoker    Quit date: 01/31/1962    Years since quitting: 58.0  Substance Use Topics   Alcohol use: Not Currently   Drug use: Never     Colonoscopy: 12/20/2008 (Dr. Earlean Shawl), repeat not indicated due to age  PAP: Dec 21, 1971, prior to hysterectomy  Bone density: date unknown.   Allergies  Allergen Reactions   Aspirin Other (See Comments)    Stomach ulcers unknown    Indomethacin    Other     Other reaction(s): GI Bleeding   Pantoprazole Nausea And Vomiting and Other (See Comments)    Stomach ulcers unknown     Current Outpatient Medications  Medication Sig Dispense Refill   ALPRAZolam (XANAX) 0.5 MG tablet      gemfibrozil (LOPID) 600 MG tablet      hydrochlorothiazide (HYDRODIURIL) 25 MG tablet TAKE 1 TABLET EVERY DAY     metoprolol tartrate (LOPRESSOR) 25 MG tablet Take 25 mg by mouth 2 (two) times daily.     mirtazapine (REMERON) 7.5 MG tablet      ramipril (ALTACE) 10 MG capsule      No current facility-administered medications for this visit.    OBJECTIVE: White woman who appears younger than stated age  86:   02/01/20 1255  BP: (!) 167/75  Pulse: (!) 54  Resp: 18  Temp: 98.5 F (36.9 C)  SpO2:  99%     Body mass index is 26.76 kg/m.   Wt Readings from Last 3 Encounters:  02/01/20 160 lb 12.8 oz (72.9 kg)      ECOG FS:1 - Symptomatic but completely ambulatory  Ocular: Sclerae unicteric, pupils round and equal Ear-nose-throat: Wearing a mask Lymphatic: No cervical or supraclavicular adenopathy Lungs no rales or rhonchi Heart regular rate and rhythm Abd soft, nontender, positive bowel sounds MSK no focal spinal tenderness, no joint edema Neuro: non-focal, well-oriented, appropriate affect Breasts: The right breast is benign.  The left breast is status post recent biopsy.  There is a minimal ecchymosis in in the inferior portion of the breast but no palpable mass.  Both axillae are benign.   LAB RESULTS:  CMP     Component Value Date/Time   NA 136 02/01/2020 1225   K 3.9 02/01/2020 1225   CL 101 02/01/2020 1225   CO2 26 02/01/2020 1225   GLUCOSE 115 (H) 02/01/2020 1225   BUN 14 02/01/2020 1225   CREATININE 0.83 02/01/2020 1225   CALCIUM 9.9 02/01/2020 1225  PROT 7.2 02/01/2020 1225   ALBUMIN 4.1 02/01/2020 1225   AST 24 02/01/2020 1225   ALT 19 02/01/2020 1225   ALKPHOS 48 02/01/2020 1225   BILITOT 0.8 02/01/2020 1225   GFRNONAA >60 02/01/2020 1225   GFRAA >60 02/01/2020 1225    No results found for: TOTALPROTELP, ALBUMINELP, A1GS, A2GS, BETS, BETA2SER, GAMS, MSPIKE, SPEI  Lab Results  Component Value Date   WBC 7.0 02/01/2020   NEUTROABS 3.0 02/01/2020   HGB 14.0 02/01/2020   HCT 39.3 02/01/2020   MCV 90.3 02/01/2020   PLT 295 02/01/2020    No results found for: LABCA2  No components found for: [id number]  No results for input(s): INR in the last 168 hours.  No results found for: LABCA2  No results found for: HWE993  No results found for: ZJI967  No results found for: ELF810  No results found for: CA2729  No components found for: HGQUANT  No results found for: CEA1 / No results found for: CEA1   No results found for: AFPTUMOR  No  results found for: CHROMOGRNA  No results found for: KPAFRELGTCHN, LAMBDASER, KAPLAMBRATIO (kappa/lambda light chains)  No results found for: HGBA, HGBA2QUANT, HGBFQUANT, HGBSQUAN (Hemoglobinopathy evaluation)   No results found for: LDH  No results found for: IRON, TIBC, IRONPCTSAT (Iron and TIBC)  Lab Results  Component Value Date   FERRITIN 52.3 02/06/2009    Urinalysis    Component Value Date/Time   COLORURINE YELLOW 11/14/2008 0801   APPEARANCEUR CLOUDY (A) 11/14/2008 0801   LABSPEC 1.017 11/14/2008 0801   PHURINE 6.0 11/14/2008 0801   GLUCOSEU NEGATIVE 11/14/2008 0801   HGBUR LARGE (A) 11/14/2008 0801   BILIRUBINUR NEGATIVE 11/14/2008 0801   KETONESUR NEGATIVE 11/14/2008 0801   PROTEINUR NEGATIVE 11/14/2008 0801   UROBILINOGEN 1.0 11/14/2008 0801   NITRITE NEGATIVE 11/14/2008 0801   LEUKOCYTESUR MODERATE (A) 11/14/2008 0801     STUDIES: No results found.   ELIGIBLE FOR AVAILABLE RESEARCH PROTOCOL:AET  ASSESSMENT: 86 y.o. [name] status post left breast upper outer quadrant biopsy 01/23/2020 for a clinical T1c N0, stage IA invasive ductal carcinoma, grade 1, estrogen and progesterone receptor positive, HER-2 not amplified, with an MIB-1 of 5%.  (1) definitive surgery pending: Lumpectomy without sentinel lymph node sampling  (2) adjuvant radiation  (3) antiestrogens to follow the completion of local treatment  PLAN: I met today with Sascha to review her new diagnosis. Specifically we discussed the biology of her breast cancer, its diagnosis, staging, treatment  options and prognosis. We first reviewed the fact that cancer is not one disease but more than 100 different diseases and that it is important to keep them separate-- otherwise when friends and relatives discuss their own cancer experiences with Kimika confusion can result. Similarly we explained that if breast cancer spreads to the bone or liver, the patient would not have bone cancer or liver cancer,  but breast cancer in the bone and breast cancer in the liver: one cancer in three places-- not 3 different cancers which otherwise would have to be treated in 3 different ways.  We discussed the difference between local and systemic therapy. In terms of loco-regional treatment, lumpectomy plus radiation is equivalent to mastectomy as far as survival is concerned. For this reason, and because the cosmetic results are generally superior, we recommend breast conserving surgery.   We then discussed the rationale for systemic therapy. There is some risk that this cancer may have already spread to other parts of her body.  Patients frequently ask at this point about bone scans, CAT scans and PET scans to find out if they have occult breast cancer somewhere else. The problem is that in early stage disease we are much more likely to find false positives then true cancers and this would expose the patient to unnecessary procedures as well as unnecessary radiation. Scans cannot answer the question the patient really would like to know, which is whether she has microscopic disease elsewhere in her body. For those reasons we do not recommend them.  Of course we would proceed to aggressive evaluation of any symptoms that might suggest metastatic disease, but that is not the case here.  Next we went over the options for systemic therapy which are anti-estrogens, anti-HER-2 immunotherapy, and chemotherapy. Terrance does not meet criteria for anti-HER-2 immunotherapy. She is a good candidate for anti-estrogens.  The question of chemotherapy is more complicated. Chemotherapy is most effective in rapidly growing, aggressive tumors. It is much less effective in low-grade, slow growing cancers, like Glynnis 's. For that reason we are not going to request an Oncotype from the definitive surgical sample, as suggested by NCCN guidelines.  However if the sentinel lymph node were to prove positive we would consider an Oncotype at that  point.  And does qualify for genetics testing. In patients who carry a deleterious mutation [for example in a  BRCA gene], the risk of a new breast cancer developing in the future may be sufficiently great that the patient may choose bilateral mastectomies. However if she wishes to keep her breasts in that situation it is safe to do so. That would require intensified screening, which generally means not only yearly mammography but a yearly breast MRI as well.   The overall plan then is for genetics testing, surgery, adjuvant radiation, and antiestrogens. Ranyia has a good understanding of the plan. She agrees with it. She knows the goal of treatment in her case is cure. She will call with any problems that may develop before her next visit here.  Total encounter time 65 minutes.Sarajane Jews C. , MD 02/01/2020 3:19 PM Medical Oncology and Hematology [facility] [place], [place] [address] Tel. [phone]    Fax. [phone]   This document serves as a record of services personally performed by Lurline Del, MD. It was created on his behalf by Wilburn Mylar, a trained medical scribe. The creation of this record is based on the scribe's personal observations and the provider's statements to them.    I, Lurline Del MD, have reviewed the above documentation for accuracy and completeness, and I agree with the above.    *Total Encounter Time as defined by the Centers for Medicare and Medicaid Services includes, in addition to the face-to-face time of a patient visit (documented in the note above) non-face-to-face time: obtaining and reviewing outside history, ordering and reviewing medications, tests or procedures, care coordination (communications with other health care professionals or caregivers) and documentation in the medical record.

## 2020-02-01 ENCOUNTER — Encounter: Payer: Self-pay | Admitting: *Deleted

## 2020-02-01 ENCOUNTER — Inpatient Hospital Stay: Payer: Medicare HMO | Attending: Oncology | Admitting: Oncology

## 2020-02-01 ENCOUNTER — Inpatient Hospital Stay: Payer: Medicare HMO

## 2020-02-01 ENCOUNTER — Ambulatory Visit
Admission: RE | Admit: 2020-02-01 | Discharge: 2020-02-01 | Disposition: A | Discharge status: Home / Self Care | Payer: Medicare HMO | Source: Ambulatory Visit | Attending: Radiation Oncology | Admitting: Radiation Oncology

## 2020-02-01 ENCOUNTER — Encounter: Payer: Self-pay | Admitting: Radiation Oncology

## 2020-02-01 ENCOUNTER — Other Ambulatory Visit: Payer: Self-pay

## 2020-02-01 ENCOUNTER — Ambulatory Visit: Payer: Self-pay | Admitting: Surgery

## 2020-02-01 ENCOUNTER — Ambulatory Visit: Payer: Medicare HMO | Admitting: Physical Therapy

## 2020-02-01 ENCOUNTER — Encounter: Payer: Self-pay | Admitting: Licensed Clinical Social Worker

## 2020-02-01 VITALS — BP 167/75 | HR 54 | Temp 98.5°F | Resp 18 | Ht 65.0 in | Wt 160.8 lb

## 2020-02-01 DIAGNOSIS — Z170 Estrogen receptor positive status [ER+]: Secondary | ICD-10-CM | POA: Diagnosis present

## 2020-02-01 DIAGNOSIS — Z800 Family history of malignant neoplasm of digestive organs: Secondary | ICD-10-CM | POA: Diagnosis not present

## 2020-02-01 DIAGNOSIS — C50912 Malignant neoplasm of unspecified site of left female breast: Secondary | ICD-10-CM

## 2020-02-01 DIAGNOSIS — Z90710 Acquired absence of both cervix and uterus: Secondary | ICD-10-CM | POA: Diagnosis not present

## 2020-02-01 DIAGNOSIS — C50412 Malignant neoplasm of upper-outer quadrant of left female breast: Secondary | ICD-10-CM

## 2020-02-01 DIAGNOSIS — Z803 Family history of malignant neoplasm of breast: Secondary | ICD-10-CM

## 2020-02-01 DIAGNOSIS — Z87891 Personal history of nicotine dependence: Secondary | ICD-10-CM | POA: Diagnosis not present

## 2020-02-01 LAB — CMP (CANCER CENTER ONLY)
ALT: 19 U/L (ref 0–44)
AST: 24 U/L (ref 15–41)
Albumin: 4.1 g/dL (ref 3.5–5.0)
Alkaline Phosphatase: 48 U/L (ref 38–126)
Anion gap: 9 (ref 5–15)
BUN: 14 mg/dL (ref 8–23)
CO2: 26 mmol/L (ref 22–32)
Calcium: 9.9 mg/dL (ref 8.9–10.3)
Chloride: 101 mmol/L (ref 98–111)
Creatinine: 0.83 mg/dL (ref 0.44–1.00)
GFR, Est AFR Am: >60 mL/min (ref >60)
GFR, Estimated: >60 mL/min (ref >60)
Glucose, Bld: 115 mg/dL — ABNORMAL HIGH (ref 70–99)
Potassium: 3.9 mmol/L (ref 3.5–5.1)
Sodium: 136 mmol/L (ref 135–145)
Total Bilirubin: 0.8 mg/dL (ref 0.3–1.2)
Total Protein: 7.2 g/dL (ref 6.5–8.1)

## 2020-02-01 LAB — CBC WITH DIFFERENTIAL (CANCER CENTER ONLY)
Abs Immature Granulocytes: 0.03 10*3/uL (ref 0.00–0.07)
Basophils Absolute: 0.1 10*3/uL (ref 0.0–0.1)
Basophils Relative: 1 %
Eosinophils Absolute: 0.4 10*3/uL (ref 0.0–0.5)
Eosinophils Relative: 5 %
HCT: 39.3 % (ref 36.0–46.0)
Hemoglobin: 14 g/dL (ref 12.0–15.0)
Immature Granulocytes: 0 %
Lymphocytes Relative: 42 %
Lymphs Abs: 2.9 10*3/uL (ref 0.7–4.0)
MCH: 32.2 pg (ref 26.0–34.0)
MCHC: 35.6 g/dL (ref 30.0–36.0)
MCV: 90.3 fL (ref 80.0–100.0)
Monocytes Absolute: 0.7 10*3/uL (ref 0.1–1.0)
Monocytes Relative: 9 %
Neutro Abs: 3 10*3/uL (ref 1.7–7.7)
Neutrophils Relative %: 43 %
Platelet Count: 295 10*3/uL (ref 150–400)
RBC: 4.35 MIL/uL (ref 3.87–5.11)
RDW: 11.9 % (ref 11.5–15.5)
WBC Count: 7 10*3/uL (ref 4.0–10.5)
nRBC: 0 % (ref 0.0–0.2)

## 2020-02-01 LAB — GENETIC SCREENING ORDER

## 2020-02-01 NOTE — H&P (View-Only) (Signed)
History of Present Illness Denise Strickland. Denise Garcilazo MD; 02/01/2020 2:33 PM) The patient is a 86 year old female who presents with breast cancer. Robins Magrinat Denise Strickland  This is an 86 year old female in remarkably good health who presented with a routine screening mammogram recently. She missed her mammogram in 2020 due to [place]. This mammogram ([facility]) revelaed a mass in the central left breast. US showed a 1.1 cm at 3:00 3 cmfn that was biopsied with clip placement. Path showed IDC ER/PR positive, Ki67 5%, Her 2 negative. She has a family history of breast cancer in her sister at age 13. The patient lives independently and still drives. She is accompanied by her granddaughter.   She has had a hysterectomy and a unilateral oophorectomy.   Past Surgical History Denise Slipper, RN; 02/01/2020 8:00 AM) Breast Biopsy Left. Cataract Surgery Bilateral. Foot Surgery Left. Hysterectomy (not due to cancer) - Complete  Diagnostic Studies History Denise Slipper, RN; 02/01/2020 8:00 AM) Colonoscopy >10 years ago Mammogram within last year Pap Smear >5 years ago  Allergies Denise Key K. Denise Broecker, MD; 02/01/2020 1:53 PM) Aspirin *ANALGESICS - NonNarcotic* Pantoprazole Sodium *CHEMICALS* Indomethacin *ANALGESICS - ANTI-INFLAMMATORY*  Medication History Denise Key K. Denise Lyvers, MD; 02/01/2020 1:53 PM) Medications Reconciled Ramipril ('10MG'$  Capsule, Oral) Active. hydroCHLOROthiazide ('25MG'$  Tablet, Oral) Active. No Current Medications (Taken starting 02/01/2020) Gemfibrozil ('600MG'$  Tablet, Oral) Active. ALPRAZolam (0.'5MG'$  Tablet, Oral) Active. Metoprolol Tartrate ('25MG'$  Tablet, Oral) Active. Mirtazapine (7.'5MG'$  Tablet, Oral) Active.  Social History Denise Slipper, RN; 02/01/2020 8:00 AM) Caffeine use Coffee. No alcohol use No drug use Tobacco use Former smoker.  Family History Denise Slipper, RN; 02/01/2020 8:00 AM) Breast Cancer Sister. Cancer Mother.  Pregnancy / Birth History Denise Slipper, RN; 02/01/2020  8:00 AM) Age at menarche 89 years. Gravida 2 Maternal age 71-30 Para 2 Regular periods  Other Problems Denise Slipper, RN; 02/01/2020 8:00 AM) Breast Cancer High blood pressure     Review of Systems Denise Slipper RN; 02/01/2020 8:00 AM) General Not Present- Appetite Loss, Chills, Fatigue, Fever, Night Sweats, Weight Gain and Weight Loss. Skin Not Present- Change in Wart/Mole, Dryness, Hives, Jaundice, New Lesions, Non-Healing Wounds, Rash and Ulcer. HEENT Present- Wears glasses/contact lenses. Not Present- Earache, Hearing Loss, Hoarseness, Nose Bleed, Oral Ulcers, Ringing in the Ears, Seasonal Allergies, Sinus Pain, Sore Throat, Visual Disturbances and Yellow Eyes. Respiratory Not Present- Bloody sputum, Chronic Cough, Difficulty Breathing, Snoring and Wheezing. Breast Not Present- Breast Mass, Breast Pain, Nipple Discharge and Skin Changes. Cardiovascular Not Present- Chest Pain, Difficulty Breathing Lying Down, Leg Cramps, Palpitations, Rapid Heart Rate, Shortness of Breath and Swelling of Extremities. Gastrointestinal Not Present- Abdominal Pain, Bloating, Bloody Stool, Change in Bowel Habits, Chronic diarrhea, Constipation, Difficulty Swallowing, Excessive gas, Gets full quickly at meals, Hemorrhoids, Indigestion, Nausea, Rectal Pain and Vomiting. Female Genitourinary Not Present- Frequency, Nocturia, Painful Urination, Pelvic Pain and Urgency. Musculoskeletal Not Present- Back Pain, Joint Pain, Joint Stiffness, Muscle Pain, Muscle Weakness and Swelling of Extremities. Neurological Not Present- Decreased Memory, Fainting, Headaches, Numbness, Seizures, Tingling, Tremor, Trouble walking and Weakness. Psychiatric Not Present- Anxiety, Bipolar, Change in Sleep Pattern, Depression, Fearful and Frequent crying. Endocrine Not Present- Cold Intolerance, Excessive Hunger, Hair Changes, Heat Intolerance, Hot flashes and New Diabetes. Hematology Not Present- Blood Thinners, Easy Bruising,  Excessive bleeding, Gland problems, HIV and Persistent Infections.   Physical Exam Denise Key K. Randal Yepiz MD; 02/01/2020 2:35 PM)  The physical exam findings are as follows: Note:Constitutional: WDWN in NAD, conversant, no obvious deformities; much younger than stated age Eyes: Pupils  equal, round; sclera anicteric; moist conjunctiva; no lid lag HENT: Oral mucosa moist; good dentition Neck: No masses palpated, trachea midline; no thyromegaly Breasts: symmetric; no palpable masses; no axillary lymphadenopathy on either side; no nipple retraction or discharge Lungs: CTA bilaterally; normal respiratory effort CV: Regular rate and rhythm; no murmurs; extremities well-perfused with no edema Abd: +bowel sounds, soft, non-tender, no palpable organomegaly; no palpable hernias Musc: Unable to assess gait; no apparent clubbing or cyanosis in extremities Lymphatic: No palpable cervical or axillary lymphadenopathy Skin: Warm, dry; no sign of jaundice Psychiatric - alert and oriented x 4; calm mood and affect    Assessment & Plan Denise Key K. Denise Flater MD; 02/01/2020 2:42 PM)  INVASIVE DUCTAL CARCINOMA OF BREAST, LEFT (C50.912) Impression: IDC 0300 3 cmfn 1.1 cm in size/ ER/PR +, Her2 negative  Current Plans Schedule for Surgery - Left radioactive seed localized lumpectomy. The surgical procedure has been discussed with the patient. Potential risks, benefits, alternative treatments, and expected outcomes have been explained. All of the patient's questions at this time have been answered. The likelihood of reaching the patient's treatment goal is good. The patient understand the proposed surgical procedure and wishes to proceed. Note:I spent approximately 30 minutes of this 55 minute visit with the patient and her granddaughter. We discussed her disease and the recommended options for treatment. Surgical options include mastectomy vs. lumpectomy +/- SLNB. Although the patient is 88, she is in good health  and is independent. She is reluctant to even consider chemotherapy. If she is adamant that she does not want any chemotherapy, there is no benefit to sentinel lymph node biopsy. However, if she would consider chemo with more information, then we should proceed with SLNB at the time of surgery. I discussed left seed localized lumpectomy as well as left SLNB with the patient. She will make her final decision regarding SLNB after speaking with oncology.  Addendum: She has decided against SLNB.  Denise Strickland. Georgette Dover, MD, [facility]  General/ Trauma Surgery   02/01/2020 2:43 PM

## 2020-02-01 NOTE — H&P (Addendum)
History of Present Illness Denise Strickland. Denise Mapp MD; 02/01/2020 2:33 PM) The patient is a 86 year old female who presents with breast cancer. Denise Strickland  This is an 86 year old female in remarkably good health who presented with a routine screening mammogram recently. She missed her mammogram in 2020 due to [place]. This mammogram ([facility]) revelaed a mass in the central left breast. US showed a 1.1 cm at 3:00 3 cmfn that was biopsied with clip placement. Path showed IDC ER/PR positive, Ki67 5%, Her 2 negative. She has a family history of breast cancer in her sister at age 36. The patient lives independently and still drives. She is accompanied by her granddaughter.   She has had a hysterectomy and a unilateral oophorectomy.   Past Surgical History Denise Slipper, RN; 02/01/2020 8:00 AM) Breast Biopsy Left. Cataract Surgery Bilateral. Foot Surgery Left. Hysterectomy (not due to cancer) - Complete  Diagnostic Studies History Denise Slipper, RN; 02/01/2020 8:00 AM) Colonoscopy >10 years ago Mammogram within last year Pap Smear >5 years ago  Allergies Denise Key K. Casidy Alberta, MD; 02/01/2020 1:53 PM) Aspirin *ANALGESICS - NonNarcotic* Pantoprazole Sodium *CHEMICALS* Indomethacin *ANALGESICS - ANTI-INFLAMMATORY*  Medication History Denise Key K. Laban Orourke, MD; 02/01/2020 1:53 PM) Medications Reconciled Ramipril ('10MG'$  Capsule, Oral) Active. hydroCHLOROthiazide ('25MG'$  Tablet, Oral) Active. No Current Medications (Taken starting 02/01/2020) Gemfibrozil ('600MG'$  Tablet, Oral) Active. ALPRAZolam (0.'5MG'$  Tablet, Oral) Active. Metoprolol Tartrate ('25MG'$  Tablet, Oral) Active. Mirtazapine (7.'5MG'$  Tablet, Oral) Active.  Social History Denise Slipper, RN; 02/01/2020 8:00 AM) Caffeine use Coffee. No alcohol use No drug use Tobacco use Former smoker.  Family History Denise Slipper, RN; 02/01/2020 8:00 AM) Breast Cancer Sister. Cancer Mother.  Pregnancy / Birth History Denise Slipper, RN; 02/01/2020  8:00 AM) Age at menarche 49 years. Gravida 2 Maternal age 41-30 Para 2 Regular periods  Other Problems Denise Slipper, RN; 02/01/2020 8:00 AM) Breast Cancer High blood pressure     Review of Systems Denise Slipper RN; 02/01/2020 8:00 AM) General Not Present- Appetite Loss, Chills, Fatigue, Fever, Night Sweats, Weight Gain and Weight Loss. Skin Not Present- Change in Wart/Mole, Dryness, Hives, Jaundice, New Lesions, Non-Healing Wounds, Rash and Ulcer. HEENT Present- Wears glasses/contact lenses. Not Present- Earache, Hearing Loss, Hoarseness, Nose Bleed, Oral Ulcers, Ringing in the Ears, Seasonal Allergies, Sinus Pain, Sore Throat, Visual Disturbances and Yellow Eyes. Respiratory Not Present- Bloody sputum, Chronic Cough, Difficulty Breathing, Snoring and Wheezing. Breast Not Present- Breast Mass, Breast Pain, Nipple Discharge and Skin Changes. Cardiovascular Not Present- Chest Pain, Difficulty Breathing Lying Down, Leg Cramps, Palpitations, Rapid Heart Rate, Shortness of Breath and Swelling of Extremities. Gastrointestinal Not Present- Abdominal Pain, Bloating, Bloody Stool, Change in Bowel Habits, Chronic diarrhea, Constipation, Difficulty Swallowing, Excessive gas, Gets full quickly at meals, Hemorrhoids, Indigestion, Nausea, Rectal Pain and Vomiting. Female Genitourinary Not Present- Frequency, Nocturia, Painful Urination, Pelvic Pain and Urgency. Musculoskeletal Not Present- Back Pain, Joint Pain, Joint Stiffness, Muscle Pain, Muscle Weakness and Swelling of Extremities. Neurological Not Present- Decreased Memory, Fainting, Headaches, Numbness, Seizures, Tingling, Tremor, Trouble walking and Weakness. Psychiatric Not Present- Anxiety, Bipolar, Change in Sleep Pattern, Depression, Fearful and Frequent crying. Endocrine Not Present- Cold Intolerance, Excessive Hunger, Hair Changes, Heat Intolerance, Hot flashes and New Diabetes. Hematology Not Present- Blood Thinners, Easy Bruising,  Excessive bleeding, Gland problems, HIV and Persistent Infections.   Physical Exam Denise Key K. Kahlan Engebretson MD; 02/01/2020 2:35 PM)  The physical exam findings are as follows: Note:Constitutional: WDWN in NAD, conversant, no obvious deformities; much younger than stated age Eyes: Pupils  equal, round; sclera anicteric; moist conjunctiva; no lid lag HENT: Oral mucosa moist; good dentition Neck: No masses palpated, trachea midline; no thyromegaly Breasts: symmetric; no palpable masses; no axillary lymphadenopathy on either side; no nipple retraction or discharge Lungs: CTA bilaterally; normal respiratory effort CV: Regular rate and rhythm; no murmurs; extremities well-perfused with no edema Abd: +bowel sounds, soft, non-tender, no palpable organomegaly; no palpable hernias Musc: Unable to assess gait; no apparent clubbing or cyanosis in extremities Lymphatic: No palpable cervical or axillary lymphadenopathy Skin: Warm, dry; no sign of jaundice Psychiatric - alert and oriented x 4; calm mood and affect    Assessment & Plan Denise Key K. Davita Sublett MD; 02/01/2020 2:42 PM)  INVASIVE DUCTAL CARCINOMA OF BREAST, LEFT (C50.912) Impression: IDC 0300 3 cmfn 1.1 cm in size/ ER/PR +, Her2 negative  Current Plans Schedule for Surgery - Left radioactive seed localized lumpectomy. The surgical procedure has been discussed with the patient. Potential risks, benefits, alternative treatments, and expected outcomes have been explained. All of the patient's questions at this time have been answered. The likelihood of reaching the patient's treatment goal is good. The patient understand the proposed surgical procedure and wishes to proceed. Note:I spent approximately 30 minutes of this 55 minute visit with the patient and her granddaughter. We discussed her disease and the recommended options for treatment. Surgical options include mastectomy vs. lumpectomy +/- SLNB. Although the patient is 88, she is in good health  and is independent. She is reluctant to even consider chemotherapy. If she is adamant that she does not want any chemotherapy, there is no benefit to sentinel lymph node biopsy. However, if she would consider chemo with more information, then we should proceed with SLNB at the time of surgery. I discussed left seed localized lumpectomy as well as left SLNB with the patient. She will make her final decision regarding SLNB after speaking with oncology.  Addendum: She has decided against SLNB.  Denise Strickland. Georgette Dover, MD, [facility]  General/ Trauma Surgery   02/01/2020 2:43 PM

## 2020-02-01 NOTE — Progress Notes (Signed)
Clinical Social Work [place] Psychosocial Distress Screening [name]   Patient completed distress screening protocol and scored a 10 on the Psychosocial Distress Thermometer which indicates severe distress. Clinical Social Worker met with patient and patient's granddaughter, [name] in [facility] to assess for distress and other psychosocial needs.  Patient stated she was feeling overwhelmed but felt better after meeting with the treatment team and getting more information on her treatment plan.  She has always been very active and healthy and wants this taken care of so she can continue that way.   Patient has strong support from her sons and grandchildren. They all live very close to one another. 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 No concerns with resources at this time.  CSW and patient discussed common feeling and emotions when being diagnosed with cancer, and the importance of support during treatment.  CSW informed patient of the support team and support services at [facility].  CSW provided contact information and encouraged patient to call with any questions or concerns.    Distress Screen: ONCBCN DISTRESS SCREENING 02/01/2020  Screening Type Initial Screening  Distress experienced in past week (1-10) 10  Emotional problem type Adjusting to illness  Information Concerns Type Lack of info about diagnosis  Physical Problem type Sleep/insomnia     [place]

## 2020-02-01 NOTE — H&P (View-Only) (Signed)
History of Present Illness Denise Strickland. Denise Vise MD; 02/01/2020 2:33 PM) The patient is a 86 year old female who presents with breast cancer. Denise Strickland  This is an 86 year old female in remarkably good health who presented with a routine screening mammogram recently. She missed her mammogram in 2020 due to [place]. This mammogram ([facility]) revelaed a mass in the central left breast. US showed a 1.1 cm at 3:00 3 cmfn that was biopsied with clip placement. Path showed IDC ER/PR positive, Ki67 5%, Her 2 negative. She has a family history of breast cancer in her sister at age 50. The patient lives independently and still drives. She is accompanied by her granddaughter.   She has had a hysterectomy and a unilateral oophorectomy.   Past Surgical History Denise Slipper, RN; 02/01/2020 8:00 AM) Breast Biopsy Left. Cataract Surgery Bilateral. Foot Surgery Left. Hysterectomy (not due to cancer) - Complete  Diagnostic Studies History Denise Slipper, RN; 02/01/2020 8:00 AM) Colonoscopy >10 years ago Mammogram within last year Pap Smear >5 years ago  Allergies Denise Key K. Kristal Perl, MD; 02/01/2020 1:53 PM) Aspirin *ANALGESICS - NonNarcotic* Pantoprazole Sodium *CHEMICALS* Indomethacin *ANALGESICS - ANTI-INFLAMMATORY*  Medication History Denise Key K. Mehlani Blankenburg, MD; 02/01/2020 1:53 PM) Medications Reconciled Ramipril ('10MG'$  Capsule, Oral) Active. hydroCHLOROthiazide ('25MG'$  Tablet, Oral) Active. No Current Medications (Taken starting 02/01/2020) Gemfibrozil ('600MG'$  Tablet, Oral) Active. ALPRAZolam (0.'5MG'$  Tablet, Oral) Active. Metoprolol Tartrate ('25MG'$  Tablet, Oral) Active. Mirtazapine (7.'5MG'$  Tablet, Oral) Active.  Social History Denise Slipper, RN; 02/01/2020 8:00 AM) Caffeine use Coffee. No alcohol use No drug use Tobacco use Former smoker.  Family History Denise Slipper, RN; 02/01/2020 8:00 AM) Breast Cancer Sister. Cancer Mother.  Pregnancy / Birth History Denise Slipper, RN; 02/01/2020  8:00 AM) Age at menarche 66 years. Gravida 2 Maternal age 58-30 Para 2 Regular periods  Other Problems Denise Slipper, RN; 02/01/2020 8:00 AM) Breast Cancer High blood pressure     Review of Systems Denise Slipper RN; 02/01/2020 8:00 AM) General Not Present- Appetite Loss, Chills, Fatigue, Fever, Night Sweats, Weight Gain and Weight Loss. Skin Not Present- Change in Wart/Mole, Dryness, Hives, Jaundice, New Lesions, Non-Healing Wounds, Rash and Ulcer. HEENT Present- Wears glasses/contact lenses. Not Present- Earache, Hearing Loss, Hoarseness, Nose Bleed, Oral Ulcers, Ringing in the Ears, Seasonal Allergies, Sinus Pain, Sore Throat, Visual Disturbances and Yellow Eyes. Respiratory Not Present- Bloody sputum, Chronic Cough, Difficulty Breathing, Snoring and Wheezing. Breast Not Present- Breast Mass, Breast Pain, Nipple Discharge and Skin Changes. Cardiovascular Not Present- Chest Pain, Difficulty Breathing Lying Down, Leg Cramps, Palpitations, Rapid Heart Rate, Shortness of Breath and Swelling of Extremities. Gastrointestinal Not Present- Abdominal Pain, Bloating, Bloody Stool, Change in Bowel Habits, Chronic diarrhea, Constipation, Difficulty Swallowing, Excessive gas, Gets full quickly at meals, Hemorrhoids, Indigestion, Nausea, Rectal Pain and Vomiting. Female Genitourinary Not Present- Frequency, Nocturia, Painful Urination, Pelvic Pain and Urgency. Musculoskeletal Not Present- Back Pain, Joint Pain, Joint Stiffness, Muscle Pain, Muscle Weakness and Swelling of Extremities. Neurological Not Present- Decreased Memory, Fainting, Headaches, Numbness, Seizures, Tingling, Tremor, Trouble walking and Weakness. Psychiatric Not Present- Anxiety, Bipolar, Change in Sleep Pattern, Depression, Fearful and Frequent crying. Endocrine Not Present- Cold Intolerance, Excessive Hunger, Hair Changes, Heat Intolerance, Hot flashes and New Diabetes. Hematology Not Present- Blood Thinners, Easy Bruising,  Excessive bleeding, Gland problems, HIV and Persistent Infections.   Physical Exam Denise Key K. Ardell Aaronson MD; 02/01/2020 2:35 PM)  The physical exam findings are as follows: Note:Constitutional: WDWN in NAD, conversant, no obvious deformities; much younger than stated age Eyes: Pupils  equal, round; sclera anicteric; moist conjunctiva; no lid lag HENT: Oral mucosa moist; good dentition Neck: No masses palpated, trachea midline; no thyromegaly Breasts: symmetric; no palpable masses; no axillary lymphadenopathy on either side; no nipple retraction or discharge Lungs: CTA bilaterally; normal respiratory effort CV: Regular rate and rhythm; no murmurs; extremities well-perfused with no edema Abd: +bowel sounds, soft, non-tender, no palpable organomegaly; no palpable hernias Musc: Unable to assess gait; no apparent clubbing or cyanosis in extremities Lymphatic: No palpable cervical or axillary lymphadenopathy Skin: Warm, dry; no sign of jaundice Psychiatric - alert and oriented x 4; calm mood and affect    Assessment & Plan Denise Key K. Riyan Gavina MD; 02/01/2020 2:42 PM)  INVASIVE DUCTAL CARCINOMA OF BREAST, LEFT (C50.912) Impression: IDC 0300 3 cmfn 1.1 cm in size/ ER/PR +, Her2 negative  Current Plans Schedule for Surgery - Left radioactive seed localized lumpectomy. The surgical procedure has been discussed with the patient. Potential risks, benefits, alternative treatments, and expected outcomes have been explained. All of the patient's questions at this time have been answered. The likelihood of reaching the patient's treatment goal is good. The patient understand the proposed surgical procedure and wishes to proceed. Note:I spent approximately 30 minutes of this 55 minute visit with the patient and her granddaughter. We discussed her disease and the recommended options for treatment. Surgical options include mastectomy vs. lumpectomy +/- SLNB. Although the patient is 88, she is in good health  and is independent. She is reluctant to even consider chemotherapy. If she is adamant that she does not want any chemotherapy, there is no benefit to sentinel lymph node biopsy. However, if she would consider chemo with more information, then we should proceed with SLNB at the time of surgery. I discussed left seed localized lumpectomy as well as left SLNB with the patient. She will make her final decision regarding SLNB after speaking with oncology.  Addendum: She has decided against SLNB.  Denise Strickland. Georgette Dover, MD, [facility]  General/ Trauma Surgery   02/01/2020 2:43 PM

## 2020-02-03 ENCOUNTER — Other Ambulatory Visit: Payer: Self-pay | Admitting: *Deleted

## 2020-02-03 DIAGNOSIS — C50412 Malignant neoplasm of upper-outer quadrant of left female breast: Secondary | ICD-10-CM

## 2020-02-03 DIAGNOSIS — Z170 Estrogen receptor positive status [ER+]: Secondary | ICD-10-CM

## 2020-02-07 ENCOUNTER — Telehealth: Payer: Self-pay | Admitting: Genetic Counselor

## 2020-02-07 ENCOUNTER — Other Ambulatory Visit: Payer: Self-pay

## 2020-02-07 ENCOUNTER — Encounter (HOSPITAL_BASED_OUTPATIENT_CLINIC_OR_DEPARTMENT_OTHER): Payer: Self-pay | Admitting: Surgery

## 2020-02-07 ENCOUNTER — Inpatient Hospital Stay: Payer: Medicare HMO | Admitting: Genetic Counselor

## 2020-02-07 NOTE — Telephone Encounter (Signed)
Called regarding Ms. Schwalm's virtual genetic counseling appointment. She was unaware of this appointment but did remember discussing her family history last week. She notes that she found out her sister was diagnosed with breast cancer at the age of 95, not in her 35s as previously reported. Her known family history of cancer includes her sister (diagnosed at 4 and died at age 84 from breast cancer) and her mother (colon cancer diagnosed at 45 and died at age 82). She is unaware of any diagnoses of cancer in other relatives (including aunts/uncles/grandparents/cousins).   Based on her updated family history, she does not meet medical criteria for genetic testing. We discussed that if she is still interested in learning more about genetic testing and familial/hereditary cancer, then it would be worthwhile to keep the genetic counseling appointment. Otherwise, it would be reasonable to cancel the appointment. Ms. Vielman declined further genetic counseling at this time. I provided our contact information and encouraged Ms. Etzkorn to reach out if she has any additional questions about genetics, or if she learns about any additional family history of cancer.

## 2020-02-08 ENCOUNTER — Encounter: Payer: Self-pay | Admitting: Genetic Counselor

## 2020-02-09 ENCOUNTER — Encounter: Payer: Self-pay | Admitting: *Deleted

## 2020-02-09 NOTE — Progress Notes (Signed)
Spoke with patient to follow up from [facility] and to assess navigation needs.  Patient denies any questions or concerns at this time. Encouraged her to call if anything should arise. Patient verbalized understanding.

## 2020-02-10 ENCOUNTER — Encounter (HOSPITAL_BASED_OUTPATIENT_CLINIC_OR_DEPARTMENT_OTHER)
Admission: RE | Admit: 2020-02-10 | Discharge: 2020-02-10 | Disposition: A | Discharge status: Home / Self Care | Payer: Medicare HMO | Source: Ambulatory Visit | Attending: Surgery | Admitting: Surgery

## 2020-02-10 ENCOUNTER — Other Ambulatory Visit (HOSPITAL_COMMUNITY)
Admission: RE | Admit: 2020-02-10 | Discharge: 2020-02-10 | Disposition: A | Discharge status: Home / Self Care | Payer: Medicare HMO | Source: Ambulatory Visit | Attending: Surgery | Admitting: Surgery

## 2020-02-10 DIAGNOSIS — I10 Essential (primary) hypertension: Secondary | ICD-10-CM | POA: Insufficient documentation

## 2020-02-10 DIAGNOSIS — Z01818 Encounter for other preprocedural examination: Secondary | ICD-10-CM | POA: Diagnosis not present

## 2020-02-10 DIAGNOSIS — R001 Bradycardia, unspecified: Secondary | ICD-10-CM | POA: Insufficient documentation

## 2020-02-10 DIAGNOSIS — R9431 Abnormal electrocardiogram [ECG] [EKG]: Secondary | ICD-10-CM | POA: Insufficient documentation

## 2020-02-10 DIAGNOSIS — I440 Atrioventricular block, first degree: Secondary | ICD-10-CM | POA: Insufficient documentation

## 2020-02-10 DIAGNOSIS — Z20822 Contact with and (suspected) exposure to covid-19: Secondary | ICD-10-CM | POA: Diagnosis not present

## 2020-02-10 LAB — BASIC METABOLIC PANEL
Anion gap: 11 (ref 5–15)
BUN: 10 mg/dL (ref 8–23)
CO2: 25 mmol/L (ref 22–32)
Calcium: 9.6 mg/dL (ref 8.9–10.3)
Chloride: 99 mmol/L (ref 98–111)
Creatinine, Ser: 0.72 mg/dL (ref 0.44–1.00)
GFR calc Af Amer: >60 mL/min (ref >60)
GFR calc non Af Amer: >60 mL/min (ref >60)
Glucose, Bld: 185 mg/dL — ABNORMAL HIGH (ref 70–99)
Potassium: 4 mmol/L (ref 3.5–5.1)
Sodium: 135 mmol/L (ref 135–145)

## 2020-02-10 LAB — SARS CORONAVIRUS 2 (TAT 6-24 HRS): SARS Coronavirus 2: NEGATIVE

## 2020-02-10 NOTE — Progress Notes (Signed)

## 2020-02-14 ENCOUNTER — Other Ambulatory Visit: Payer: Self-pay

## 2020-02-14 ENCOUNTER — Ambulatory Visit (HOSPITAL_BASED_OUTPATIENT_CLINIC_OR_DEPARTMENT_OTHER): Payer: Medicare HMO | Admitting: Anesthesiology

## 2020-02-14 ENCOUNTER — Ambulatory Visit (HOSPITAL_BASED_OUTPATIENT_CLINIC_OR_DEPARTMENT_OTHER)
Admission: RE | Admit: 2020-02-14 | Discharge: 2020-02-14 | Disposition: A | Discharge status: Home / Self Care | Payer: Medicare HMO | Attending: Surgery | Admitting: Surgery

## 2020-02-14 ENCOUNTER — Encounter (HOSPITAL_BASED_OUTPATIENT_CLINIC_OR_DEPARTMENT_OTHER): Payer: Self-pay | Admitting: Surgery

## 2020-02-14 ENCOUNTER — Encounter (HOSPITAL_BASED_OUTPATIENT_CLINIC_OR_DEPARTMENT_OTHER)
Admission: RE | Disposition: A | Discharge status: Home / Self Care | Payer: Self-pay | Source: Home / Self Care | Attending: Surgery

## 2020-02-14 DIAGNOSIS — Z170 Estrogen receptor positive status [ER+]: Secondary | ICD-10-CM | POA: Diagnosis not present

## 2020-02-14 DIAGNOSIS — Z79899 Other long term (current) drug therapy: Secondary | ICD-10-CM | POA: Insufficient documentation

## 2020-02-14 DIAGNOSIS — F419 Anxiety disorder, unspecified: Secondary | ICD-10-CM | POA: Diagnosis not present

## 2020-02-14 DIAGNOSIS — I10 Essential (primary) hypertension: Secondary | ICD-10-CM | POA: Insufficient documentation

## 2020-02-14 DIAGNOSIS — C50112 Malignant neoplasm of central portion of left female breast: Secondary | ICD-10-CM | POA: Insufficient documentation

## 2020-02-14 DIAGNOSIS — Z87891 Personal history of nicotine dependence: Secondary | ICD-10-CM | POA: Diagnosis not present

## 2020-02-14 DIAGNOSIS — Z803 Family history of malignant neoplasm of breast: Secondary | ICD-10-CM | POA: Insufficient documentation

## 2020-02-14 HISTORY — PX: BREAST LUMPECTOMY WITH RADIOACTIVE SEED LOCALIZATION: SHX6424

## 2020-02-14 LAB — GLUCOSE, CAPILLARY: Glucose-Capillary: 119 mg/dL — ABNORMAL HIGH (ref 70–99)

## 2020-02-14 SURGERY — BREAST LUMPECTOMY WITH RADIOACTIVE SEED LOCALIZATION
Anesthesia: General | Site: Breast | Laterality: Left

## 2020-02-14 MED ORDER — BUPIVACAINE HCL (PF) 0.25 % IJ SOLN
INTRAMUSCULAR | Status: DC | PRN
  Administered 2020-02-14: 20 mL

## 2020-02-14 MED ORDER — PROMETHAZINE HCL 25 MG/ML IJ SOLN: 6.2500 mg | INTRAMUSCULAR | Status: DC | PRN

## 2020-02-14 MED ORDER — OXYCODONE HCL 5 MG PO TABS
ORAL_TABLET | ORAL | Status: AC
  Filled 2020-02-14: qty 1

## 2020-02-14 MED ORDER — DEXAMETHASONE SODIUM PHOSPHATE 4 MG/ML IJ SOLN
INTRAMUSCULAR | Status: DC | PRN
  Administered 2020-02-14: 10 mg via INTRAVENOUS

## 2020-02-14 MED ORDER — OXYCODONE HCL 5 MG PO TABS
5.0000 mg | ORAL_TABLET | Freq: Once | ORAL | Status: AC | PRN
  Administered 2020-02-14: 5 mg via ORAL

## 2020-02-14 MED ORDER — GLYCOPYRROLATE PF 0.2 MG/ML IJ SOSY
PREFILLED_SYRINGE | INTRAMUSCULAR | Status: AC
  Filled 2020-02-14: qty 1

## 2020-02-14 MED ORDER — LIDOCAINE 2% (20 MG/ML) 5 ML SYRINGE
INTRAMUSCULAR | Status: AC
  Filled 2020-02-14: qty 5

## 2020-02-14 MED ORDER — ACETAMINOPHEN 500 MG PO TABS
ORAL_TABLET | ORAL | Status: AC
  Filled 2020-02-14: qty 2

## 2020-02-14 MED ORDER — CEFAZOLIN SODIUM-DEXTROSE 2-4 GM/100ML-% IV SOLN
INTRAVENOUS | Status: AC
  Filled 2020-02-14: qty 100

## 2020-02-14 MED ORDER — EPHEDRINE SULFATE 50 MG/ML IJ SOLN
INTRAMUSCULAR | Status: DC | PRN
  Administered 2020-02-14: 10 mg via INTRAVENOUS

## 2020-02-14 MED ORDER — ONDANSETRON HCL 4 MG/2ML IJ SOLN
INTRAMUSCULAR | Status: AC
  Filled 2020-02-14: qty 2

## 2020-02-14 MED ORDER — GABAPENTIN 300 MG PO CAPS
ORAL_CAPSULE | ORAL | Status: AC
  Filled 2020-02-14: qty 1

## 2020-02-14 MED ORDER — MIDAZOLAM HCL 2 MG/2ML IJ SOLN: 1.0000 mg | INTRAMUSCULAR | Status: DC | PRN

## 2020-02-14 MED ORDER — LACTATED RINGERS IV SOLN: INTRAVENOUS | Status: DC

## 2020-02-14 MED ORDER — ACETAMINOPHEN 500 MG PO TABS
1000.0000 mg | ORAL_TABLET | ORAL | Status: AC
  Administered 2020-02-14: 1000 mg via ORAL

## 2020-02-14 MED ORDER — DEXAMETHASONE SODIUM PHOSPHATE 10 MG/ML IJ SOLN
INTRAMUSCULAR | Status: AC
  Filled 2020-02-14: qty 1

## 2020-02-14 MED ORDER — PROPOFOL 10 MG/ML IV BOLUS
INTRAVENOUS | Status: DC | PRN
  Administered 2020-02-14: 110 mg via INTRAVENOUS

## 2020-02-14 MED ORDER — CHLORHEXIDINE GLUCONATE CLOTH 2 % EX PADS: 6.0000 | MEDICATED_PAD | Freq: Once | CUTANEOUS | Status: DC

## 2020-02-14 MED ORDER — CEFAZOLIN SODIUM-DEXTROSE 2-4 GM/100ML-% IV SOLN
2.0000 g | INTRAVENOUS | Status: AC
  Administered 2020-02-14: 2 g via INTRAVENOUS

## 2020-02-14 MED ORDER — HYDROMORPHONE HCL 1 MG/ML IJ SOLN: 0.2500 mg | INTRAMUSCULAR | Status: DC | PRN

## 2020-02-14 MED ORDER — FENTANYL CITRATE (PF) 100 MCG/2ML IJ SOLN
INTRAMUSCULAR | Status: DC | PRN
  Administered 2020-02-14: 50 ug via INTRAVENOUS

## 2020-02-14 MED ORDER — LIDOCAINE-EPINEPHRINE 1 %-1:100000 IJ SOLN
INTRAMUSCULAR | Status: AC
  Filled 2020-02-14: qty 1

## 2020-02-14 MED ORDER — GLYCOPYRROLATE 0.2 MG/ML IJ SOLN
INTRAMUSCULAR | Status: DC | PRN
  Administered 2020-02-14 (×2): .1 mg via INTRAVENOUS

## 2020-02-14 MED ORDER — FENTANYL CITRATE (PF) 100 MCG/2ML IJ SOLN: 50.0000 ug | INTRAMUSCULAR | Status: DC | PRN

## 2020-02-14 MED ORDER — ONDANSETRON HCL 4 MG/2ML IJ SOLN
INTRAMUSCULAR | Status: DC | PRN
  Administered 2020-02-14: 4 mg via INTRAVENOUS

## 2020-02-14 MED ORDER — HYDROCODONE-ACETAMINOPHEN 5-325 MG PO TABS: 1.0000 | ORAL_TABLET | Freq: Four times a day (QID) | ORAL | 0 refills | Status: DC | PRN

## 2020-02-14 MED ORDER — EPHEDRINE 5 MG/ML INJ
INTRAVENOUS | Status: AC
  Filled 2020-02-14: qty 10

## 2020-02-14 MED ORDER — LIDOCAINE HCL (CARDIAC) PF 100 MG/5ML IV SOSY
PREFILLED_SYRINGE | INTRAVENOUS | Status: DC | PRN
  Administered 2020-02-14: 30 mg via INTRAVENOUS

## 2020-02-14 MED ORDER — OXYCODONE HCL 5 MG/5ML PO SOLN: 5.0000 mg | Freq: Once | ORAL | Status: AC | PRN

## 2020-02-14 MED ORDER — BUPIVACAINE HCL (PF) 0.25 % IJ SOLN
INTRAMUSCULAR | Status: AC
  Filled 2020-02-14: qty 30

## 2020-02-14 MED ORDER — FENTANYL CITRATE (PF) 100 MCG/2ML IJ SOLN
INTRAMUSCULAR | Status: AC
  Filled 2020-02-14: qty 2

## 2020-02-14 MED ORDER — GABAPENTIN 300 MG PO CAPS
300.0000 mg | ORAL_CAPSULE | ORAL | Status: AC
  Administered 2020-02-14: 300 mg via ORAL

## 2020-02-14 SURGICAL SUPPLY — 65 items
APL PRP STRL LF DISP 70% ISPRP (MISCELLANEOUS) ×1
APL SKNCLS STERI-STRIP NONHPOA (GAUZE/BANDAGES/DRESSINGS) ×1
APPLIER CLIP 9.375 MED OPEN (MISCELLANEOUS) ×3
APR CLP MED 9.3 20 MLT OPN (MISCELLANEOUS) ×1
BENZOIN TINCTURE PRP APPL 2/3 (GAUZE/BANDAGES/DRESSINGS) ×3 IMPLANT
BLADE HEX COATED 2.75 (ELECTRODE) ×3 IMPLANT
BLADE SURG 15 STRL LF DISP TIS (BLADE) ×1 IMPLANT
BLADE SURG 15 STRL SS (BLADE) ×3
CANISTER SUC SOCK COL 7IN (MISCELLANEOUS) IMPLANT
CANISTER SUCT 1200ML W/VALVE (MISCELLANEOUS) IMPLANT
CHLORAPREP W/TINT 26 (MISCELLANEOUS) ×3 IMPLANT
CLIP APPLIE 9.375 MED OPEN (MISCELLANEOUS) ×1 IMPLANT
CLOSURE WOUND 1/2 X4 (GAUZE/BANDAGES/DRESSINGS) ×1
COVER BACK TABLE 60X90IN (DRAPES) ×3 IMPLANT
COVER MAYO STAND STRL (DRAPES) ×3 IMPLANT
COVER PROBE W GEL 5X96 (DRAPES) ×3 IMPLANT
COVER WAND RF STERILE (DRAPES) IMPLANT
DECANTER SPIKE VIAL GLASS SM (MISCELLANEOUS) IMPLANT
DRAPE LAPAROTOMY 100X72 PEDS (DRAPES) ×3 IMPLANT
DRAPE UTILITY XL STRL (DRAPES) ×3 IMPLANT
DRSG TEGADERM 4X4.75 (GAUZE/BANDAGES/DRESSINGS) ×3 IMPLANT
ELECT BLADE 4.0 EZ CLEAN MEGAD (MISCELLANEOUS) ×3
ELECT REM PT RETURN 9FT ADLT (ELECTROSURGICAL) ×3
ELECTRODE BLDE 4.0 EZ CLN MEGD (MISCELLANEOUS) IMPLANT
ELECTRODE REM PT RTRN 9FT ADLT (ELECTROSURGICAL) ×1 IMPLANT
GAUZE SPONGE 4X4 12PLY STRL LF (GAUZE/BANDAGES/DRESSINGS) ×2 IMPLANT
GLOVE BIO SURGEON STRL SZ7 (GLOVE) ×3 IMPLANT
GLOVE BIOGEL PI IND STRL 6.5 (GLOVE) IMPLANT
GLOVE BIOGEL PI IND STRL 7.0 (GLOVE) IMPLANT
GLOVE BIOGEL PI IND STRL 7.5 (GLOVE) ×1 IMPLANT
GLOVE BIOGEL PI INDICATOR 6.5 (GLOVE) ×2
GLOVE BIOGEL PI INDICATOR 7.0 (GLOVE) ×2
GLOVE BIOGEL PI INDICATOR 7.5 (GLOVE) ×2
GLOVE SURG SYN 7.5  E (GLOVE) ×3
GLOVE SURG SYN 7.5 E (GLOVE) ×1 IMPLANT
GLOVE SURG SYN 7.5 PF PI (GLOVE) IMPLANT
GOWN STRL REUS W/ TWL LRG LVL3 (GOWN DISPOSABLE) ×2 IMPLANT
GOWN STRL REUS W/ TWL XL LVL3 (GOWN DISPOSABLE) IMPLANT
GOWN STRL REUS W/TWL LRG LVL3 (GOWN DISPOSABLE) ×3
GOWN STRL REUS W/TWL XL LVL3 (GOWN DISPOSABLE) ×3
ILLUMINATOR WAVEGUIDE N/F (MISCELLANEOUS) IMPLANT
KIT MARKER MARGIN INK (KITS) ×3 IMPLANT
LIGHT WAVEGUIDE WIDE FLAT (MISCELLANEOUS) IMPLANT
NDL HYPO 25X1 1.5 SAFETY (NEEDLE) ×1 IMPLANT
NEEDLE HYPO 25X1 1.5 SAFETY (NEEDLE) ×3 IMPLANT
NS IRRIG 1000ML POUR BTL (IV SOLUTION) ×3 IMPLANT
PACK BASIN DAY SURGERY FS (CUSTOM PROCEDURE TRAY) ×3 IMPLANT
PENCIL SMOKE EVACUATOR (MISCELLANEOUS) ×3 IMPLANT
SLEEVE SCD COMPRESS KNEE MED (MISCELLANEOUS) ×3 IMPLANT
SPONGE GAUZE 2X2 8PLY STER LF (GAUZE/BANDAGES/DRESSINGS) ×1
SPONGE GAUZE 2X2 8PLY STRL LF (GAUZE/BANDAGES/DRESSINGS) ×1 IMPLANT
SPONGE LAP 18X18 RF (DISPOSABLE) IMPLANT
SPONGE LAP 4X18 RFD (DISPOSABLE) ×3 IMPLANT
STRIP CLOSURE SKIN 1/2X4 (GAUZE/BANDAGES/DRESSINGS) ×2 IMPLANT
SUT MON AB 4-0 PC3 18 (SUTURE) ×3 IMPLANT
SUT SILK 2 0 SH (SUTURE) IMPLANT
SUT VIC AB 3-0 SH 27 (SUTURE) ×3
SUT VIC AB 3-0 SH 27X BRD (SUTURE) ×1 IMPLANT
SYR BULB EAR ULCER 3OZ GRN STR (SYRINGE) IMPLANT
SYR CONTROL 10ML LL (SYRINGE) ×3 IMPLANT
TOWEL GREEN STERILE FF (TOWEL DISPOSABLE) ×3 IMPLANT
TRAY FAXITRON CT DISP (TRAY / TRAY PROCEDURE) ×3 IMPLANT
TUBE CONNECTING 20'X1/4 (TUBING)
TUBE CONNECTING 20X1/4 (TUBING) IMPLANT
YANKAUER SUCT BULB TIP NO VENT (SUCTIONS) IMPLANT

## 2020-02-14 NOTE — Transfer of Care (Signed)
Immediate Anesthesia Transfer of Care Note  Patient: Devonne Doughty  Procedure(s) Performed: LEFT BREAST LUMPECTOMY WITH RADIOACTIVE SEED LOCALIZATION (Left Breast)  Patient Location: PACU  Anesthesia Type:General  Level of Consciousness: awake and patient cooperative  Airway & Oxygen Therapy: Patient Spontanous Breathing and Patient connected to face mask oxygen  Post-op Assessment: Report given to RN and Post -op Vital signs reviewed and stable  Post vital signs: Reviewed and stable  Last Vitals:  Vitals Value Taken Time  BP    Temp    Pulse 71 02/14/20 1003  Resp 16 02/14/20 1003  SpO2 100 % 02/14/20 1003  Vitals shown include unvalidated device data.  Last Pain:  Vitals:   02/14/20 0743  TempSrc: Tympanic  PainSc: 0-No pain         Complications: No apparent anesthesia complications

## 2020-02-14 NOTE — Op Note (Signed)
Pre-op Diagnosis:  Invasive ductal carcinoma - left breast Post-op Diagnosis: same Procedure:  Left radioactive seed localized lumpectomy Surgeon:  Xxavier Noon K. Anesthesia:  GEN - LMA Indications:   This is an 86 year old female in remarkably good health who presented with a routine screening mammogram recently. She missed her mammogram in 2020 due to [place]. This mammogram ([facility]) revelaed a mass in the central left breast. US showed a 1.1 cm at 3:00 3 cmfn that was biopsied with clip placement. Path showed IDC ER/PR positive, Ki67 5%, Her 2 negative. She has a family history of breast cancer in her sister at age 51. The patient lives independently and still drives. She is accompanied by her granddaughter. A radioactive seed was placed yesterday by [facility] and was confirmed in the pre-op area.  Description of procedure: The patient is brought to the operating room placed in supine position on the operating room table. After an adequate level of general anesthesia was obtained, her left breast was prepped with ChloraPrep and draped in sterile fashion. A timeout was taken to ensure the proper patient and proper procedure. We interrogated the breast with the neoprobe. We made a circumareolar incision around the lower side of the nipple after infiltrating with 0.25% Marcaine. Dissection was carried down in the breast tissue with cautery. We used the neoprobe to guide [place] towards the radioactive seed. We excised an area of tissue around the radioactive seed 2 cm in diameter.  The posterior margin is the pectoralis fascia. The specimen was removed and was oriented with a paint kit. Specimen mammogram showed the radioactive seed as well as the biopsy clip within the specimen. This was sent for pathologic examination. There is no residual radioactivity within the biopsy cavity. We inspected carefully for hemostasis. The wound was thoroughly irrigated.  Local anesthetic was injected into the pectoralis  muscle. The wound was closed with a deep layer of 3-0 Vicryl and a subcuticular layer of 4-0 Monocryl. Benzoin Steri-Strips were applied. The patient was then extubated and brought to the recovery room in stable condition. All sponge, instrument, and needle counts are correct.  Imogene Burn. Georgette Dover, MD, [facility]  General/ Trauma Surgery  02/14/2020 9:54 AM

## 2020-02-14 NOTE — Anesthesia Postprocedure Evaluation (Signed)
Anesthesia Post Note  Patient: AMAR GROSHONG  Procedure(s) Performed: LEFT BREAST LUMPECTOMY WITH RADIOACTIVE SEED LOCALIZATION (Left Breast)     Patient location during evaluation: PACU Anesthesia Type: General Level of consciousness: awake and alert Pain management: pain level controlled Vital Signs Assessment: post-procedure vital signs reviewed and stable Respiratory status: spontaneous breathing, nonlabored ventilation and respiratory function stable Cardiovascular status: blood pressure returned to baseline and stable Postop Assessment: no apparent nausea or vomiting Anesthetic complications: no    Last Vitals:  Vitals:   02/14/20 1015 02/14/20 1030  BP: (!) 179/72 (!) 177/81  Pulse: 62 66  Resp: (!) 8 18  Temp:    SpO2: 100% 100%    Last Pain:  Vitals:   02/14/20 1030  TempSrc:   PainSc: 0-No pain                 Lynda Rainwater

## 2020-02-14 NOTE — Anesthesia Preprocedure Evaluation (Signed)
Anesthesia Evaluation  Patient identified by MRN, date of birth, ID band Patient awake    Reviewed: Allergy & Precautions, NPO status , Patient's Chart, lab work & pertinent test results, reviewed documented beta blocker date and time   Airway Mallampati: II  TM Distance: >3 FB Neck ROM: Full    Dental no notable dental hx.    Pulmonary neg pulmonary ROS, former smoker,    Pulmonary exam normal breath sounds clear to auscultation       Cardiovascular hypertension, Pt. on medications and Pt. on home beta blockers negative cardio ROS Normal cardiovascular exam Rhythm:Regular Rate:Normal     Neuro/Psych Anxiety negative neurological ROS  negative psych ROS   GI/Hepatic negative GI ROS, Neg liver ROS,   Endo/Other  negative endocrine ROSdiabetes  Renal/GU negative Renal ROS  negative genitourinary   Musculoskeletal negative musculoskeletal ROS (+)   Abdominal   Peds negative pediatric ROS (+)  Hematology negative hematology ROS (+)   Anesthesia Other Findings Breast Cancer  Reproductive/Obstetrics negative OB ROS                             Anesthesia Physical Anesthesia Plan  ASA: III  Anesthesia Plan: General   Post-op Pain Management:    Induction: Intravenous  PONV Risk Score and Plan: 3 and Ondansetron, Dexamethasone, Midazolam and Treatment may vary due to age or medical condition  Airway Management Planned: LMA  Additional Equipment:   Intra-op Plan:   Post-operative Plan: Extubation in OR  Informed Consent: I have reviewed the patients History and Physical, chart, labs and discussed the procedure including the risks, benefits and alternatives for the proposed anesthesia with the patient or authorized representative who has indicated his/her understanding and acceptance.     Dental advisory given  Plan Discussed with: CRNA  Anesthesia Plan Comments:          Anesthesia Quick Evaluation

## 2020-02-14 NOTE — Discharge Instructions (Signed)
[place] Office Phone Number [phone]  BREAST BIOPSY/ PARTIAL MASTECTOMY: POST OP INSTRUCTIONS  Always review your discharge instruction sheet given to you by the facility where your surgery was performed.  IF YOU HAVE DISABILITY OR FAMILY LEAVE FORMS, YOU MUST BRING THEM TO THE OFFICE FOR PROCESSING.  DO NOT GIVE THEM TO YOUR DOCTOR.  1. A prescription for pain medication may be given to you upon discharge.  Take your pain medication as prescribed, if needed.  If narcotic pain medicine is not needed, then you may take acetaminophen (Tylenol) or ibuprofen (Advil) as needed. 2. Take your usually prescribed medications unless otherwise directed 3. If you need a refill on your pain medication, please contact your pharmacy.  They will contact our office to request authorization.  Prescriptions will not be filled after 5pm or on week-ends. 4. You should eat very light the first 24 hours after surgery, such as soup, crackers, pudding, etc.  Resume your normal diet the day after surgery. 5. Most patients will experience some swelling and bruising in the breast.  Ice packs and a good support bra will help.  Swelling and bruising can take several days to resolve.  6. It is common to experience some constipation if taking pain medication after surgery.  Increasing fluid intake and taking a stool softener will usually help or prevent this problem from occurring.  A mild laxative (Milk of Magnesia or Miralax) should be taken according to package directions if there are no bowel movements after 48 hours. 7. Unless discharge instructions indicate otherwise, you may remove your bandages 24-48 hours after surgery, and you may shower at that time.  You may have steri-strips (small skin tapes) in place directly over the incision.  These strips should be left on the skin for 7-10 days.  If your surgeon used skin glue on the incision, you may shower in 24 hours.  The glue will flake off over the  next 2-3 weeks.  Any sutures or staples will be removed at the office during your follow-up visit. 8. ACTIVITIES:  You may resume regular daily activities (gradually increasing) beginning the next day.  Wearing a good support bra or sports bra minimizes pain and swelling.  You may have sexual intercourse when it is comfortable. a. You may drive when you no longer are taking prescription pain medication, you can comfortably wear a seatbelt, and you can safely maneuver your car and apply brakes. b. RETURN TO WORK:  ______________________________________________________________________________________ 9. You should see your doctor in the office for a follow-up appointment approximately two weeks after your surgery.  Your doctor's nurse will typically make your follow-up appointment when she calls you with your pathology report.  Expect your pathology report 2-3 business days after your surgery.  You may call to check if you do not hear from [place] after three days. 10. OTHER INSTRUCTIONS: _______________________________________________________________________________________________ _____________________________________________________________________________________________________________________________________ _____________________________________________________________________________________________________________________________________ _____________________________________________________________________________________________________________________________________  WHEN TO CALL YOUR DOCTOR: 1. Fever over 101.0 2. Nausea and/or vomiting. 3. Extreme swelling or bruising. 4. Continued bleeding from incision. 5. Increased pain, redness, or drainage from the incision.  The clinic staff is available to answer your questions during regular business hours.  Please don't hesitate to call and ask to speak to one of the nurses for clinical concerns.  If you have a medical emergency, go to the nearest  emergency room or call 911.  A surgeon from [facility] is always on call at the hospital.  For further questions, please visit [email or web address]  No Tylenol before 2:00pm   Post Anesthesia Home Care Instructions  Activity: Get plenty of rest for the remainder of the day. A responsible individual must stay with you for 24 hours following the procedure.  For the next 24 hours, DO NOT: -Drive a car -Paediatric nurse -Drink alcoholic beverages -Take any medication unless instructed by your physician -Make any legal decisions or sign important papers.  Meals: Start with liquid foods such as gelatin or soup. Progress to regular foods as tolerated. Avoid greasy, spicy, heavy foods. If nausea and/or vomiting occur, drink only clear liquids until the nausea and/or vomiting subsides. Call your physician if vomiting continues.  Special Instructions/Symptoms: Your throat may feel dry or sore from the anesthesia or the breathing tube placed in your throat during surgery. If this causes discomfort, gargle with warm salt water. The discomfort should disappear within 24 hours.  If you had a scopolamine patch placed behind your ear for the management of post- operative nausea and/or vomiting:  1. The medication in the patch is effective for 72 hours, after which it should be removed.  Wrap patch in a tissue and discard in the trash. Wash hands thoroughly with soap and water. 2. You may remove the patch earlier than 72 hours if you experience unpleasant side effects which may include dry mouth, dizziness or visual disturbances. 3. Avoid touching the patch. Wash your hands with soap and water after contact with the patch.

## 2020-02-14 NOTE — Interval H&P Note (Signed)
History and Physical Interval Note:  02/14/2020 8:45 AM  Denise Strickland  has presented today for surgery, with the diagnosis of LEFT INVASIVE DUCTAL CARCINOMA.  The various methods of treatment have been discussed with the patient and family. After consideration of risks, benefits and other options for treatment, the patient has consented to  Procedure(s) with comments: LEFT BREAST LUMPECTOMY WITH RADIOACTIVE SEED LOCALIZATION (Left) - LMA as a surgical intervention.  The patient's history has been reviewed, patient examined, no change in status, stable for surgery.  I have reviewed the patient's chart and labs.  Questions were answered to the patient's satisfaction.     [name]

## 2020-02-14 NOTE — Anesthesia Procedure Notes (Signed)
Procedure Name: LMA Insertion Date/Time: 02/14/2020 9:03 AM Performed by: Marrianne Mood, CRNA Pre-anesthesia Checklist: Patient identified, Emergency Drugs available, Suction available, Patient being monitored and Timeout performed Patient Re-evaluated:Patient Re-evaluated prior to induction Oxygen Delivery Method: Circle system utilized Preoxygenation: Pre-oxygenation with 100% oxygen Induction Type: IV induction Ventilation: Mask ventilation without difficulty LMA: LMA inserted LMA Size: 4.0 Number of attempts: 1 Airway Equipment and Method: Bite block Placement Confirmation: positive ETCO2 Tube secured with: Tape Dental Injury: Teeth and Oropharynx as per pre-operative assessment

## 2020-02-15 ENCOUNTER — Encounter: Payer: Self-pay | Admitting: *Deleted

## 2020-02-18 LAB — SURGICAL PATHOLOGY

## 2020-02-20 ENCOUNTER — Ambulatory Visit: Payer: Self-pay | Admitting: Surgery

## 2020-02-20 ENCOUNTER — Encounter: Payer: Self-pay | Admitting: *Deleted

## 2020-02-21 ENCOUNTER — Encounter: Payer: Self-pay | Admitting: *Deleted

## 2020-02-22 ENCOUNTER — Encounter (HOSPITAL_BASED_OUTPATIENT_CLINIC_OR_DEPARTMENT_OTHER): Payer: Self-pay | Admitting: Surgery

## 2020-02-22 ENCOUNTER — Telehealth: Payer: Self-pay

## 2020-02-22 ENCOUNTER — Other Ambulatory Visit: Payer: Self-pay

## 2020-02-22 NOTE — Telephone Encounter (Signed)
Nutrition  Patient identified as attending Breast Clinic on 02/01/20.    Chart reviewed.  RD called patient and no answer or option to leave voicemail.  Patient received nutrition packet of information with RD's contact information on 4/7.  Please consult RD if needed for this patient.  Finnlee Guarnieri B. Zenia Resides, [place], [place] Registered Dietitian [phone] (pager)

## 2020-02-23 NOTE — Progress Notes (Signed)

## 2020-02-25 ENCOUNTER — Other Ambulatory Visit (HOSPITAL_COMMUNITY)
Admission: RE | Admit: 2020-02-25 | Discharge: 2020-02-25 | Disposition: A | Discharge status: Home / Self Care | Payer: Medicare HMO | Source: Ambulatory Visit | Attending: Surgery | Admitting: Surgery

## 2020-02-25 DIAGNOSIS — Z01812 Encounter for preprocedural laboratory examination: Secondary | ICD-10-CM | POA: Diagnosis present

## 2020-02-25 DIAGNOSIS — Z20822 Contact with and (suspected) exposure to covid-19: Secondary | ICD-10-CM | POA: Insufficient documentation

## 2020-02-25 LAB — SARS CORONAVIRUS 2 (TAT 6-24 HRS): SARS Coronavirus 2: NEGATIVE

## 2020-02-29 ENCOUNTER — Encounter (HOSPITAL_BASED_OUTPATIENT_CLINIC_OR_DEPARTMENT_OTHER)
Admission: RE | Disposition: A | Discharge status: Home / Self Care | Payer: Self-pay | Source: Ambulatory Visit | Attending: Surgery

## 2020-02-29 ENCOUNTER — Ambulatory Visit (HOSPITAL_BASED_OUTPATIENT_CLINIC_OR_DEPARTMENT_OTHER): Payer: Medicare HMO | Admitting: Anesthesiology

## 2020-02-29 ENCOUNTER — Encounter (HOSPITAL_BASED_OUTPATIENT_CLINIC_OR_DEPARTMENT_OTHER): Payer: Self-pay | Admitting: Surgery

## 2020-02-29 ENCOUNTER — Ambulatory Visit (HOSPITAL_BASED_OUTPATIENT_CLINIC_OR_DEPARTMENT_OTHER)
Admission: RE | Admit: 2020-02-29 | Discharge: 2020-02-29 | Disposition: A | Discharge status: Home / Self Care | Payer: Medicare HMO | Source: Ambulatory Visit | Attending: Surgery | Admitting: Surgery

## 2020-02-29 ENCOUNTER — Other Ambulatory Visit: Payer: Self-pay

## 2020-02-29 DIAGNOSIS — Z9842 Cataract extraction status, left eye: Secondary | ICD-10-CM | POA: Diagnosis not present

## 2020-02-29 DIAGNOSIS — Z886 Allergy status to analgesic agent status: Secondary | ICD-10-CM | POA: Diagnosis not present

## 2020-02-29 DIAGNOSIS — N6092 Unspecified benign mammary dysplasia of left breast: Secondary | ICD-10-CM | POA: Diagnosis not present

## 2020-02-29 DIAGNOSIS — K279 Peptic ulcer, site unspecified, unspecified as acute or chronic, without hemorrhage or perforation: Secondary | ICD-10-CM | POA: Insufficient documentation

## 2020-02-29 DIAGNOSIS — Z9841 Cataract extraction status, right eye: Secondary | ICD-10-CM | POA: Diagnosis not present

## 2020-02-29 DIAGNOSIS — Z90710 Acquired absence of both cervix and uterus: Secondary | ICD-10-CM | POA: Diagnosis not present

## 2020-02-29 DIAGNOSIS — Z87891 Personal history of nicotine dependence: Secondary | ICD-10-CM | POA: Diagnosis not present

## 2020-02-29 DIAGNOSIS — I10 Essential (primary) hypertension: Secondary | ICD-10-CM | POA: Insufficient documentation

## 2020-02-29 DIAGNOSIS — Z79899 Other long term (current) drug therapy: Secondary | ICD-10-CM | POA: Insufficient documentation

## 2020-02-29 DIAGNOSIS — C50912 Malignant neoplasm of unspecified site of left female breast: Secondary | ICD-10-CM | POA: Diagnosis not present

## 2020-02-29 DIAGNOSIS — Z803 Family history of malignant neoplasm of breast: Secondary | ICD-10-CM | POA: Diagnosis not present

## 2020-02-29 DIAGNOSIS — Z888 Allergy status to other drugs, medicaments and biological substances status: Secondary | ICD-10-CM | POA: Diagnosis not present

## 2020-02-29 DIAGNOSIS — F419 Anxiety disorder, unspecified: Secondary | ICD-10-CM | POA: Insufficient documentation

## 2020-02-29 DIAGNOSIS — Z809 Family history of malignant neoplasm, unspecified: Secondary | ICD-10-CM | POA: Diagnosis not present

## 2020-02-29 HISTORY — PX: RE-EXCISION OF BREAST LUMPECTOMY: SHX6048

## 2020-02-29 HISTORY — DX: Prediabetes: R73.03

## 2020-02-29 SURGERY — EXCISION, LESION, BREAST
Anesthesia: General | Site: Breast | Laterality: Left

## 2020-02-29 MED ORDER — FENTANYL CITRATE (PF) 100 MCG/2ML IJ SOLN: 50.0000 ug | INTRAMUSCULAR | Status: DC | PRN

## 2020-02-29 MED ORDER — ONDANSETRON HCL 4 MG/2ML IJ SOLN
INTRAMUSCULAR | Status: DC | PRN
  Administered 2020-02-29: 4 mg via INTRAVENOUS

## 2020-02-29 MED ORDER — LACTATED RINGERS IV SOLN: INTRAVENOUS | Status: DC

## 2020-02-29 MED ORDER — CEFAZOLIN SODIUM-DEXTROSE 2-4 GM/100ML-% IV SOLN
INTRAVENOUS | Status: AC
  Filled 2020-02-29: qty 100

## 2020-02-29 MED ORDER — MIDAZOLAM HCL 2 MG/2ML IJ SOLN: 1.0000 mg | INTRAMUSCULAR | Status: DC | PRN

## 2020-02-29 MED ORDER — PROPOFOL 10 MG/ML IV BOLUS
INTRAVENOUS | Status: DC | PRN
  Administered 2020-02-29: 100 mg via INTRAVENOUS

## 2020-02-29 MED ORDER — CHLORHEXIDINE GLUCONATE CLOTH 2 % EX PADS: 6.0000 | MEDICATED_PAD | Freq: Once | CUTANEOUS | Status: DC

## 2020-02-29 MED ORDER — 0.9 % SODIUM CHLORIDE (POUR BTL) OPTIME
TOPICAL | Status: DC | PRN
  Administered 2020-02-29: 13:00:00 200 mL

## 2020-02-29 MED ORDER — BUPIVACAINE HCL (PF) 0.25 % IJ SOLN
INTRAMUSCULAR | Status: DC | PRN
  Administered 2020-02-29: 10 mL

## 2020-02-29 MED ORDER — PROMETHAZINE HCL 25 MG/ML IJ SOLN: 6.2500 mg | INTRAMUSCULAR | Status: DC | PRN

## 2020-02-29 MED ORDER — ACETAMINOPHEN 500 MG PO TABS
ORAL_TABLET | ORAL | Status: AC
  Filled 2020-02-29: qty 2

## 2020-02-29 MED ORDER — GABAPENTIN 300 MG PO CAPS
300.0000 mg | ORAL_CAPSULE | ORAL | Status: AC
  Administered 2020-02-29: 300 mg via ORAL

## 2020-02-29 MED ORDER — FENTANYL CITRATE (PF) 100 MCG/2ML IJ SOLN: 25.0000 ug | INTRAMUSCULAR | Status: DC | PRN

## 2020-02-29 MED ORDER — CEFAZOLIN SODIUM-DEXTROSE 2-4 GM/100ML-% IV SOLN
2.0000 g | INTRAVENOUS | Status: AC
  Administered 2020-02-29: 2 g via INTRAVENOUS

## 2020-02-29 MED ORDER — DEXAMETHASONE SODIUM PHOSPHATE 10 MG/ML IJ SOLN
INTRAMUSCULAR | Status: DC | PRN
  Administered 2020-02-29: 8 mg via INTRAVENOUS

## 2020-02-29 MED ORDER — ACETAMINOPHEN 500 MG PO TABS
1000.0000 mg | ORAL_TABLET | ORAL | Status: AC
  Administered 2020-02-29: 1000 mg via ORAL

## 2020-02-29 MED ORDER — EPHEDRINE SULFATE-NACL 50-0.9 MG/10ML-% IV SOSY
PREFILLED_SYRINGE | INTRAVENOUS | Status: DC | PRN
  Administered 2020-02-29 (×2): 10 mg via INTRAVENOUS

## 2020-02-29 MED ORDER — FENTANYL CITRATE (PF) 100 MCG/2ML IJ SOLN
INTRAMUSCULAR | Status: DC | PRN
  Administered 2020-02-29: 50 ug via INTRAVENOUS
  Administered 2020-02-29 (×2): 25 ug via INTRAVENOUS

## 2020-02-29 MED ORDER — GABAPENTIN 300 MG PO CAPS
ORAL_CAPSULE | ORAL | Status: AC
  Filled 2020-02-29: qty 1

## 2020-02-29 MED ORDER — LIDOCAINE 2% (20 MG/ML) 5 ML SYRINGE
INTRAMUSCULAR | Status: DC | PRN
  Administered 2020-02-29: 80 mg via INTRAVENOUS

## 2020-02-29 MED ORDER — FENTANYL CITRATE (PF) 100 MCG/2ML IJ SOLN
INTRAMUSCULAR | Status: AC
  Filled 2020-02-29: qty 2

## 2020-02-29 SURGICAL SUPPLY — 52 items
APL PRP STRL LF DISP 70% ISPRP (MISCELLANEOUS) ×1
APL SKNCLS STERI-STRIP NONHPOA (GAUZE/BANDAGES/DRESSINGS) ×1
BENZOIN TINCTURE PRP APPL 2/3 (GAUZE/BANDAGES/DRESSINGS) ×3 IMPLANT
BLADE HEX COATED 2.75 (ELECTRODE) ×1 IMPLANT
BLADE SURG 15 STRL LF DISP TIS (BLADE) ×1 IMPLANT
BLADE SURG 15 STRL SS (BLADE) ×3
CANISTER SUCT 1200ML W/VALVE (MISCELLANEOUS) ×2 IMPLANT
CHLORAPREP W/TINT 26 (MISCELLANEOUS) ×3 IMPLANT
CLOSURE WOUND 1/2 X4 (GAUZE/BANDAGES/DRESSINGS) ×1
COVER BACK TABLE 60X90IN (DRAPES) ×3 IMPLANT
COVER MAYO STAND STRL (DRAPES) ×3 IMPLANT
COVER WAND RF STERILE (DRAPES) IMPLANT
DECANTER SPIKE VIAL GLASS SM (MISCELLANEOUS) IMPLANT
DRAPE LAPAROTOMY 100X72 PEDS (DRAPES) ×3 IMPLANT
DRAPE UTILITY XL STRL (DRAPES) ×3 IMPLANT
DRSG TEGADERM 4X4.75 (GAUZE/BANDAGES/DRESSINGS) ×3 IMPLANT
ELECT REM PT RETURN 9FT ADLT (ELECTROSURGICAL) ×3
ELECTRODE REM PT RTRN 9FT ADLT (ELECTROSURGICAL) ×1 IMPLANT
GAUZE SPONGE 4X4 12PLY STRL LF (GAUZE/BANDAGES/DRESSINGS) IMPLANT
GLOVE BIO SURGEON STRL SZ7 (GLOVE) ×3 IMPLANT
GLOVE BIOGEL PI IND STRL 6.5 (GLOVE) IMPLANT
GLOVE BIOGEL PI IND STRL 7.0 (GLOVE) IMPLANT
GLOVE BIOGEL PI IND STRL 7.5 (GLOVE) ×1 IMPLANT
GLOVE BIOGEL PI INDICATOR 6.5 (GLOVE) ×2
GLOVE BIOGEL PI INDICATOR 7.0 (GLOVE) ×2
GLOVE BIOGEL PI INDICATOR 7.5 (GLOVE) ×2
GLOVE ECLIPSE 6.5 STRL STRAW (GLOVE) ×2 IMPLANT
GOWN STRL REUS W/ TWL LRG LVL3 (GOWN DISPOSABLE) ×2 IMPLANT
GOWN STRL REUS W/TWL LRG LVL3 (GOWN DISPOSABLE) ×6
KIT MARKER MARGIN INK (KITS) ×2 IMPLANT
NDL HYPO 25X1 1.5 SAFETY (NEEDLE) ×1 IMPLANT
NEEDLE HYPO 25X1 1.5 SAFETY (NEEDLE) ×3 IMPLANT
NS IRRIG 1000ML POUR BTL (IV SOLUTION) ×3 IMPLANT
PENCIL SMOKE EVACUATOR (MISCELLANEOUS) ×3 IMPLANT
SET BASIN DAY SURGERY F.S. (CUSTOM PROCEDURE TRAY) ×3 IMPLANT
SLEEVE SCD COMPRESS KNEE MED (MISCELLANEOUS) ×3 IMPLANT
SPONGE GAUZE 2X2 8PLY STER LF (GAUZE/BANDAGES/DRESSINGS) ×1
SPONGE GAUZE 2X2 8PLY STRL LF (GAUZE/BANDAGES/DRESSINGS) ×1 IMPLANT
SPONGE LAP 4X18 RFD (DISPOSABLE) ×3 IMPLANT
STRIP CLOSURE SKIN 1/2X4 (GAUZE/BANDAGES/DRESSINGS) ×2 IMPLANT
SUT CHROMIC 3 0 SH 27 (SUTURE) IMPLANT
SUT MON AB 4-0 PC3 18 (SUTURE) ×3 IMPLANT
SUT SILK 2 0 SH (SUTURE) IMPLANT
SUT VIC AB 3-0 SH 27 (SUTURE) ×3
SUT VIC AB 3-0 SH 27X BRD (SUTURE) ×1 IMPLANT
SYR BULB EAR ULCER 3OZ GRN STR (SYRINGE) ×3 IMPLANT
SYR CONTROL 10ML LL (SYRINGE) ×3 IMPLANT
TOWEL GREEN STERILE FF (TOWEL DISPOSABLE) ×3 IMPLANT
TRAY FAXITRON CT DISP (TRAY / TRAY PROCEDURE) IMPLANT
TUBE CONNECTING 20'X1/4 (TUBING) ×1
TUBE CONNECTING 20X1/4 (TUBING) ×2 IMPLANT
YANKAUER SUCT BULB TIP NO VENT (SUCTIONS) ×3 IMPLANT

## 2020-02-29 NOTE — Op Note (Signed)
Preop diagnosis: Invasive ductal carcinoma left breast Postop diagnosis: Same Procedure performed: Reexcision of medial and inferior lumpectomy margins Surgeon:Quintana Canelo K Johni Narine Anesthesia General via LMA Indications: This is an 86 year old female in good health who was recently diagnosed with a 1.1 cm invasive ductal carcinoma of the left breast.  After consultation, she decided against the possibility of chemotherapy.  On 02/14/2020 she underwent a left radioactive seed localized lumpectomy.  We did not perform sentinel lymph node biopsy.  We took the lumpectomy off of the underlying pectoralis muscle.  The medial and inferior margins were positive on the pathology report so she presents now for reexcision.  She is planning to have radiation.  Description of procedure: The patient is brought to the operating room and placed in the supine position on the operating room table.  After an adequate level of general anesthesia was obtained, her left breast was prepped with ChloraPrep and draped sterile fashion.  A timeout was taken to ensure the proper patient and proper procedure.  We infiltrated some local anesthetic around the nipple.  I opened her previous circumareolar incision.  We entered the seroma cavity which was evacuated.  I again confirmed that the posterior margin of the lumpectomy site included the pectoralis fascia so there is no additional posterior margin.  I excised 1 cm thick inferior and medial margins.  These were oriented with the paint kit.  Marking clips were placed.  We inspected carefully for hemostasis.  No gross disease is palpated.  The wound was closed with a deep layer of 3-0 Vicryl and a subcuticular layer 4-0 Monocryl.  Benzoin Steri-Strips were applied.  The patient was then extubated and brought to the recovery room in stable condition.  All sponge, instrument, and needle counts are correct.  Imogene Burn. Georgette Dover, MD, [facility]  General/ Trauma  Surgery   02/29/2020 1:10 PM

## 2020-02-29 NOTE — Anesthesia Procedure Notes (Signed)
Procedure Name: LMA Insertion Date/Time: 02/29/2020 12:32 PM Performed by: Genelle Bal, CRNA Pre-anesthesia Checklist: Patient identified, Emergency Drugs available, Suction available and Patient being monitored Patient Re-evaluated:Patient Re-evaluated prior to induction Oxygen Delivery Method: Circle system utilized Preoxygenation: Pre-oxygenation with 100% oxygen Induction Type: IV induction Ventilation: Mask ventilation without difficulty LMA: LMA inserted LMA Size: 4.0 Number of attempts: 1 Airway Equipment and Method: Bite block Placement Confirmation: positive ETCO2 Tube secured with: Tape Dental Injury: Teeth and Oropharynx as per pre-operative assessment

## 2020-02-29 NOTE — Discharge Instructions (Signed)
[facility] °Office Phone Number [phone] ° °BREAST BIOPSY/ PARTIAL MASTECTOMY: POST OP INSTRUCTIONS ° °Always review your discharge instruction sheet given to you by the facility where your surgery was performed. ° °IF YOU HAVE DISABILITY OR FAMILY LEAVE FORMS, YOU MUST BRING THEM TO THE OFFICE FOR PROCESSING.  DO NOT GIVE THEM TO YOUR DOCTOR. ° °1. A prescription for pain medication may be given to you upon discharge.  Take your pain medication as prescribed, if needed.  If narcotic pain medicine is not needed, then you may take acetaminophen (Tylenol) or ibuprofen (Advil) as needed. °2. Take your usually prescribed medications unless otherwise directed °3. If you need a refill on your pain medication, please contact your pharmacy.  They will contact our office to request authorization.  Prescriptions will not be filled after 5pm or on week-ends. °4. You should eat very light the first 24 hours after surgery, such as soup, crackers, pudding, etc.  Resume your normal diet the day after surgery. °5. Most patients will experience some swelling and bruising in the breast.  Ice packs and a good support bra will help.  Swelling and bruising can take several days to resolve.  °6. It is common to experience some constipation if taking pain medication after surgery.  Increasing fluid intake and taking a stool softener will usually help or prevent this problem from occurring.  A mild laxative (Milk of Magnesia or Miralax) should be taken according to package directions if there are no bowel movements after 48 hours. °7. Unless discharge instructions indicate otherwise, you may remove your bandages 24-48 hours after surgery, and you may shower at that time.  You may have steri-strips (small skin tapes) in place directly over the incision.  These strips should be left on the skin for 7-10 days.  If your surgeon used skin glue on the incision, you may shower in 24 hours.  The glue will flake off over the  next 2-3 weeks.  Any sutures or staples will be removed at the office during your follow-up visit. °8. ACTIVITIES:  You may resume regular daily activities (gradually increasing) beginning the next day.  Wearing a good support bra or sports bra minimizes pain and swelling.  You may have sexual intercourse when it is comfortable. °a. You may drive when you no longer are taking prescription pain medication, you can comfortably wear a seatbelt, and you can safely maneuver your car and apply brakes. °b. RETURN TO WORK:  ______________________________________________________________________________________ °9. You should see your doctor in the office for a follow-up appointment approximately two weeks after your surgery.  Your doctor’s nurse will typically make your follow-up appointment when she calls you with your pathology report.  Expect your pathology report 2-3 business days after your surgery.  You may call to check if you do not hear from us after three days. °10. OTHER INSTRUCTIONS: _______________________________________________________________________________________________ _____________________________________________________________________________________________________________________________________ °_____________________________________________________________________________________________________________________________________ °_____________________________________________________________________________________________________________________________________ ° °WHEN TO CALL YOUR DOCTOR: °1. Fever over 101.0 °2. Nausea and/or vomiting. °3. Extreme swelling or bruising. °4. Continued bleeding from incision. °5. Increased pain, redness, or drainage from the incision. ° °The clinic staff is available to answer your questions during regular business hours.  Please don’t hesitate to call and ask to speak to one of the nurses for clinical concerns.  If you have a medical emergency, go to the nearest  emergency room or call 911.  A surgeon from [facility] is always on call at the hospital. ° °For further questions, please visit [email or web address]  ° ° °  Post Anesthesia Home Care Instructions  Activity: Get plenty of rest for the remainder of the day. A responsible individual must stay with you for 24 hours following the procedure.  For the next 24 hours, DO NOT: -Drive a car -Paediatric nurse -Drink alcoholic beverages -Take any medication unless instructed by your physician -Make any legal decisions or sign important papers.  Meals: Start with liquid foods such as gelatin or soup. Progress to regular foods as tolerated. Avoid greasy, spicy, heavy foods. If nausea and/or vomiting occur, drink only clear liquids until the nausea and/or vomiting subsides. Call your physician if vomiting continues.  Special Instructions/Symptoms: Your throat may feel dry or sore from the anesthesia or the breathing tube placed in your throat during surgery. If this causes discomfort, gargle with warm salt water. The discomfort should disappear within 24 hours.  **May have Tylenol at 6:30pm today 02/29/20

## 2020-02-29 NOTE — Transfer of Care (Signed)
Immediate Anesthesia Transfer of Care Note  Patient: Denise Strickland  Procedure(s) Performed: RE-EXCISION OF LEFT BREAST LUMPECTOMY INFERIOR AND MEDIAL MARGINS (Left Breast)  Patient Location: PACU  Anesthesia Type:General  Level of Consciousness: awake, alert  and oriented  Airway & Oxygen Therapy: Patient Spontanous Breathing and Patient connected to face mask oxygen  Post-op Assessment: Report given to RN and Post -op Vital signs reviewed and stable  Post vital signs: Reviewed and stable  Last Vitals:  Vitals Value Taken Time  BP 184/70 02/29/20 1313  Temp    Pulse 62 02/29/20 1315  Resp 9 02/29/20 1315  SpO2 100 % 02/29/20 1315  Vitals shown include unvalidated device data.  Last Pain:  Vitals:   02/29/20 1217  TempSrc: Oral  PainSc: 0-No pain         Complications: No apparent anesthesia complications

## 2020-02-29 NOTE — Anesthesia Preprocedure Evaluation (Addendum)
Anesthesia Evaluation  Patient identified by MRN, date of birth, ID band Patient awake    Reviewed: Allergy & Precautions, NPO status , Patient's Chart, lab work & pertinent test results  Airway Mallampati: II  TM Distance: >3 FB Neck ROM: Full    Dental no notable dental hx. (+) Dental Advisory Given   Pulmonary former smoker,    Pulmonary exam normal        Cardiovascular hypertension, Pt. on home beta blockers and Pt. on medications Normal cardiovascular exam     Neuro/Psych Anxiety negative neurological ROS     GI/Hepatic Neg liver ROS, PUD,   Endo/Other  negative endocrine ROS  Renal/GU negative Renal ROS     Musculoskeletal negative musculoskeletal ROS (+)   Abdominal   Peds  Hematology negative hematology ROS (+)   Anesthesia Other Findings Day of surgery medications reviewed with the patient.  Reproductive/Obstetrics                            Anesthesia Physical Anesthesia Plan  ASA: III  Anesthesia Plan: General   Post-op Pain Management:    Induction: Intravenous  PONV Risk Score and Plan: 3 and Treatment may vary due to age or medical condition, Ondansetron and Dexamethasone  Airway Management Planned: LMA  Additional Equipment:   Intra-op Plan:   Post-operative Plan: Extubation in OR  Informed Consent: I have reviewed the patients History and Physical, chart, labs and discussed the procedure including the risks, benefits and alternatives for the proposed anesthesia with the patient or authorized representative who has indicated his/her understanding and acceptance.     Dental advisory given  Plan Discussed with: Anesthesiologist and CRNA  Anesthesia Plan Comments:        Anesthesia Quick Evaluation

## 2020-02-29 NOTE — Anesthesia Postprocedure Evaluation (Signed)
Anesthesia Post Note  Patient: Denise Strickland  Procedure(s) Performed: RE-EXCISION OF LEFT BREAST LUMPECTOMY INFERIOR AND MEDIAL MARGINS (Left Breast)     Patient location during evaluation: PACU Anesthesia Type: General Level of consciousness: sedated Pain management: pain level controlled Vital Signs Assessment: post-procedure vital signs reviewed and stable Respiratory status: spontaneous breathing and respiratory function stable Cardiovascular status: stable Postop Assessment: no apparent nausea or vomiting Anesthetic complications: no    Last Vitals:  Vitals:   02/29/20 1345 02/29/20 1424  BP: (!) 185/92 (!) 180/78  Pulse: 64 63  Resp: (!) 8 14  Temp:  36.8 C  SpO2: 100% 100%    Last Pain:  Vitals:   02/29/20 1424  TempSrc: Oral  PainSc: 0-No pain                 [name]

## 2020-02-29 NOTE — Interval H&P Note (Signed)
History and Physical Interval Note:  02/29/2020 12:05 PM   Her lumpectomy showed positive inferior and medial margins.  We will plan to reexcise these margins.   Denise Strickland  has presented today for surgery, with the diagnosis of LEFT BREAST CANCER.  The various methods of treatment have been discussed with the patient and family. After consideration of risks, benefits and other options for treatment, the patient has consented to  Procedure(s) with comments: RE-EXCISION OF LEFT BREAST INFERIOR MEDIAL MARGINS (Left) - LMA as a surgical intervention.  The patient's history has been reviewed, patient examined, no change in status, stable for surgery.  I have reviewed the patient's chart and labs.  Questions were answered to the patient's satisfaction.     Denise Strickland

## 2020-03-01 ENCOUNTER — Encounter: Payer: Self-pay | Admitting: *Deleted

## 2020-03-02 ENCOUNTER — Encounter: Payer: Self-pay | Admitting: *Deleted

## 2020-03-02 LAB — SURGICAL PATHOLOGY

## 2020-03-20 ENCOUNTER — Ambulatory Visit: Payer: Medicare HMO

## 2020-03-20 ENCOUNTER — Ambulatory Visit
Admission: RE | Admit: 2020-03-20 | Discharge: 2020-03-20 | Disposition: A | Discharge status: Home / Self Care | Payer: Medicare HMO | Source: Ambulatory Visit | Attending: Radiation Oncology | Admitting: Radiation Oncology

## 2020-03-20 ENCOUNTER — Ambulatory Visit: Payer: Medicare HMO | Admitting: Radiation Oncology

## 2020-03-20 ENCOUNTER — Encounter: Payer: Self-pay | Admitting: Radiation Oncology

## 2020-03-20 ENCOUNTER — Other Ambulatory Visit: Payer: Self-pay

## 2020-03-20 ENCOUNTER — Ambulatory Visit: Payer: Medicare HMO | Attending: Radiation Oncology | Admitting: Radiation Oncology

## 2020-03-20 VITALS — BP 173/70 | HR 56 | Temp 97.5°F | Resp 18 | Ht 65.0 in | Wt 161.2 lb

## 2020-03-20 DIAGNOSIS — Z510 Encounter for antineoplastic radiation therapy: Secondary | ICD-10-CM | POA: Diagnosis present

## 2020-03-20 DIAGNOSIS — F419 Anxiety disorder, unspecified: Secondary | ICD-10-CM | POA: Diagnosis not present

## 2020-03-20 DIAGNOSIS — Z170 Estrogen receptor positive status [ER+]: Secondary | ICD-10-CM | POA: Diagnosis present

## 2020-03-20 DIAGNOSIS — I10 Essential (primary) hypertension: Secondary | ICD-10-CM | POA: Diagnosis not present

## 2020-03-20 DIAGNOSIS — C50412 Malignant neoplasm of upper-outer quadrant of left female breast: Secondary | ICD-10-CM | POA: Insufficient documentation

## 2020-03-20 DIAGNOSIS — E7800 Pure hypercholesterolemia, unspecified: Secondary | ICD-10-CM | POA: Diagnosis not present

## 2020-03-20 DIAGNOSIS — Z79899 Other long term (current) drug therapy: Secondary | ICD-10-CM | POA: Diagnosis not present

## 2020-03-20 DIAGNOSIS — Z803 Family history of malignant neoplasm of breast: Secondary | ICD-10-CM | POA: Insufficient documentation

## 2020-03-20 DIAGNOSIS — G4700 Insomnia, unspecified: Secondary | ICD-10-CM | POA: Insufficient documentation

## 2020-03-20 DIAGNOSIS — Z87891 Personal history of nicotine dependence: Secondary | ICD-10-CM | POA: Insufficient documentation

## 2020-03-20 DIAGNOSIS — Z800 Family history of malignant neoplasm of digestive organs: Secondary | ICD-10-CM | POA: Insufficient documentation

## 2020-03-20 NOTE — Progress Notes (Signed)
Radiation Oncology         [phone] ________________________________  Name: Denise Strickland        MRN: [id number]  Date of Service: 03/20/2020 DOB: 1931-01-16   REFERRING PHYSICIAN: Dr. Georgette Dover  DIAGNOSIS: The encounter diagnosis was Malignant neoplasm of upper-outer quadrant of left breast in female, estrogen receptor positive ([place]).   HISTORY OF PRESENT ILLNESS: Denise Strickland is a 86 y.o. female originally seen in the multidisciplinary breast clinic for a new diagnosis of left breast cancer. The patient was noted to have a screening detected abnormality in the left breast on 12/28/2019, there was a possible mass seen in the central posterior depth of the left breast, subsequent diagnostic ultrasound on 01/12/2020 revealed a 1.1 cm round mass in the left breast at approximately 3:00, and her axilla was negative for adenopathy.  She underwent a biopsy that revealed a grade 1, invasive ductal carcinoma that was ER/PR positive, HER2 negative with a  Ki 67 of 5%. She proceeded with left lumpectomy on 02/14/20 which revealed a 1.2 cm invasive ductal carcinoma that was grade 1. She had associated low grade DCIS, and her margins were positive in the posterior, inferior, and medial margin with her posterior margin being the pectoralis. She underwent re-excision of her inferior and medial margins on 02/29/20. Final results of this revealed no disease in her inferior margin, and focal ductal hyperplasia 1 mm from the new margin and no invasive carcinoma. Her case was discussed in tumor board and it was felt that she still had a positive posterior margin at the pectoralis, but that her tumor profile was also favorable, so she could consider radiotherapy versus consider forgoing this if she proceeds with antiestrogen therapy.   PREVIOUS RADIATION THERAPY: No   PAST MEDICAL HISTORY:  Past Medical History:  Diagnosis Date  . Allergy   . Anxiety   . Herpes simplex    from [facility] chart  . Hypercholesterolemia      from [facility] chart  . Hyperglycemia    from [facility] chart  . Hypertension   . Insomnia    from [facility] chart  . Pre-diabetes        PAST SURGICAL HISTORY:  Past Surgical History:  Procedure Laterality Date  . ABDOMINAL HYSTERECTOMY    . BREAST LUMPECTOMY WITH RADIOACTIVE SEED LOCALIZATION Left 02/14/2020   Procedure: LEFT BREAST LUMPECTOMY WITH RADIOACTIVE SEED LOCALIZATION;  Surgeon: Donnie Mesa, MD;  Location: [place];  Service: General;  Laterality: Left;  LMA  . CATARACT EXTRACTION     from [facility] chart  . HEEL SPUR SURGERY Bilateral    from [facility] chart  . RE-EXCISION OF BREAST LUMPECTOMY Left 02/29/2020   Procedure: RE-EXCISION OF LEFT BREAST LUMPECTOMY INFERIOR AND MEDIAL MARGINS;  Surgeon: Donnie Mesa, MD;  Location: [place];  Service: General;  Laterality: Left;  LMA      FAMILY HISTORY:  Family History  Problem Relation Age of Onset  . Colon cancer Mother 52  . Breast cancer Sister 54  . Asthma Brother      SOCIAL HISTORY:  reports that she quit smoking about 58 years ago. She has never used smokeless tobacco. She reports previous alcohol use. She reports that she does not use drugs.  The patient is widowed and lives in [place]. She is accompanied by  her grand daughter in law. 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 She enjoys fishing.   ALLERGIES: Aspirin, Indomethacin, Other, and Pantoprazole  MEDICATIONS:  Current Outpatient Medications  Medication Sig Dispense Refill  . ALPRAZolam (XANAX) 0.5 MG tablet     . gemfibrozil (LOPID) 600 MG tablet     . hydrochlorothiazide (HYDRODIURIL) 25 MG tablet TAKE 1 TABLET EVERY DAY    . HYDROcodone-acetaminophen (NORCO/VICODIN) 5-325 MG tablet Take 1 tablet by mouth every 6 (six) hours as needed for moderate pain. 15 tablet 0  . metoprolol tartrate (LOPRESSOR) 25 MG tablet Take 25 mg by mouth 2 (two) times daily.    . mirtazapine (REMERON) 7.5 MG tablet     . ramipril (ALTACE) 10 MG  capsule      No current facility-administered medications for this encounter.     REVIEW OF SYSTEMS: On review of systems, the patient reports that she is doing well overall. She reports only mild soreness after her surgeries but denies any concerns with her healing. No other complaints are verbalized.      PHYSICAL EXAM:  Wt Readings from Last 3 Encounters:  02/29/20 162 lb 7.7 oz (73.7 kg)  02/14/20 162 lb 7.7 oz (73.7 kg)  02/01/20 160 lb 12.8 oz (72.9 kg)   Temp Readings from Last 3 Encounters:  02/29/20 98.2 F (36.8 C) (Oral)  02/14/20 97.6 F (36.4 C)  02/01/20 98.5 F (36.9 C) (Temporal)   BP Readings from Last 3 Encounters:  02/29/20 (!) 180/78  02/14/20 (!) 195/70  02/01/20 (!) 167/75   Pulse Readings from Last 3 Encounters:  02/29/20 63  02/14/20 (!) 58  02/01/20 (!) 54    In general this is a well appearing caucasian female in no acute distress. She's alert and oriented x4 and appropriate throughout the examination. Cardiopulmonary assessment is negative for acute distress and she exhibits normal effort. Bilateral breast exam is deferred.    ECOG = 0  0 - Asymptomatic (Fully active, able to carry on all predisease activities without restriction)  1 - Symptomatic but completely ambulatory (Restricted in physically strenuous activity but ambulatory and able to carry out work of a light or sedentary nature. For example, light housework, office work)  2 - Symptomatic, <50% in bed during the day (Ambulatory and capable of all self care but unable to carry out any work activities. Up and about more than 50% of waking hours)  3 - Symptomatic, >50% in bed, but not bedbound (Capable of only limited self-care, confined to bed or chair 50% or more of waking hours)  4 - Bedbound (Completely disabled. Cannot carry on any self-care. Totally confined to bed or chair)  5 - Death   Eustace Pen MM, Creech RH, Tormey DC, et al. [phone]). "Toxicity and response criteria of the  [facility]". [place] Oncol. 5 (6): 649-55    LABORATORY DATA:  Lab Results  Component Value Date   WBC 7.0 02/01/2020   HGB 14.0 02/01/2020   HCT 39.3 02/01/2020   MCV 90.3 02/01/2020   PLT 295 02/01/2020   Lab Results  Component Value Date   NA 135 02/10/2020   K 4.0 02/10/2020   CL 99 02/10/2020   CO2 25 02/10/2020   Lab Results  Component Value Date   ALT 19 02/01/2020   AST 24 02/01/2020   ALKPHOS 48 02/01/2020   BILITOT 0.8 02/01/2020      RADIOGRAPHY: No results found.     IMPRESSION/PLAN: 1. Stage IA, pT1cN0M0 grade 1, ER/PR positive invasive ductal carcinoma of the left breast. Dr. [name] discusses the final pathology findings and reviews the  nature of left breast disease. She has favorable features to possibly avoid radiotherapy, but we did discuss options to pursue external radiotherapy to the breast followed by antiestrogen therapy given her positive posterior margin status. We discussed the risks, benefits, short, and long term effects of radiotherapy, and the patient is interested in proceeding. Dr. [name] discusses the delivery and logistics of radiotherapy and anticipates a course of 4 weeks of radiotherapy with deep inspiration breath hold technique. Written consent is obtained and placed in the chart, a copy was provided to the patient. She will simulate for treatment today. 2. Hypertension. She was encouraged today to follow up with her PCP regarding her BP today and the last few times it's been checked. She is asymptomatic however.   In a visit lasting 45 minutes, greater than 50% of the time was spent face to face reviewing her case, as well as in preparation of, discussing, and coordinating the patient's care.  The above documentation reflects my direct findings during this shared patient visit. Please see the separate note by Dr. [name] on this date for the remainder of the patient's plan of care.    Carola Rhine, PAC

## 2020-03-21 ENCOUNTER — Encounter: Payer: Self-pay | Admitting: *Deleted

## 2020-03-22 DIAGNOSIS — Z510 Encounter for antineoplastic radiation therapy: Secondary | ICD-10-CM | POA: Diagnosis not present

## 2020-03-26 NOTE — Progress Notes (Signed)
[facility]  [phone] [phone]     ID: Denise NERIO DOB: 08-May-1931  MR#: [id number]  WVP#:[id number]  Patient Care Team: [name], PA-C as PCP - General (Family Medicine) Mauro Kaufmann, RN as Oncology Nurse Navigator Rockwell Germany, RN as Oncology Nurse Navigator Donnie Mesa, MD as Consulting Physician (General Surgery) Aadil Sur, Virgie Dad, MD as Consulting Physician (Oncology) Kyung Rudd, MD as Consulting Physician (Radiation Oncology) Chauncey Cruel, MD OTHER MD:  CHIEF COMPLAINT: Estrogen receptor positive breast cancer  CURRENT TREATMENT: Adjuvant radiation   INTERVAL HISTORY: Denise Strickland returns today for follow up of her estrogen receptor positive breast cancer. She was evaluated in the multidisciplinary breast cancer clinic on 02/01/2020.  She opted to proceed with left lumpectomy on 02/14/2020 under Dr. Georgette Dover. Pathology from the procedure ([id number]) showed: invasive ductal carcinoma, 1.2 cm, grade 1; ductal carcinoma in situ, low grade; margins involved.  She underwent re-excision of the positive margins on 02/29/2020. Pathology [id number]) showed: no invasive carcinoma; focal atypical ductal hyperplasia.   REVIEW OF SYSTEMS: Denise Strickland did very well with her surgery.  She took 2 pain pills and has not had any further problems regarding pain.  She was not constipated from the pain medication.  She has had her Covid vaccines.  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  A detailed review of systems today was otherwise stable   HISTORY OF CURRENT ILLNESS: From the original intake note:  Denise Strickland had routine screening mammography on 12/28/2019 showing a possible abnormality in the left breast. She underwent left diagnostic mammography with tomography and left breast ultrasonography at [facility] on 01/12/2020 showing: breast density category B; 1.1 cm round mass in the left breast  at 3 o'clock; no significant left axilla abnormalities.  Accordingly on 01/23/2020 she proceeded to biopsy of the left breast area in question. The pathology from this procedure ([id number]) showed: invasive ductal carcinoma, grade 1. Prognostic indicators significant for: estrogen receptor, 100% positive and progesterone receptor, 70% positive, both with strong staining intensity. Proliferation marker Ki67 at 5%. HER2 negative by immunohistochemistry (1+).  The patient's subsequent history is as detailed below.   PAST MEDICAL HISTORY: Past Medical History:  Diagnosis Date  . Allergy   . Anxiety   . Herpes simplex    from [facility] chart  . Hypercholesterolemia    from [facility] chart  . Hyperglycemia    from [facility] chart  . Hypertension   . Insomnia    from [facility] chart  . Pre-diabetes      PAST SURGICAL HISTORY: The patient is status post hysterectomy and unilateral salpingo-oophorectomy, is a remote right arm cyst removal, has had Achilles tendon surgery on both heels, is status post urethral support surgery under Dr. Idamae Schuller   FAMILY HISTORY: Family History  Problem Relation Age of Onset  . Colon cancer Mother 91  . Breast cancer Sister 73  . Asthma Brother    Her mother was diagnosed with colon cancer at age 78 and lived to age 35. Her father died at age 23 from pneumonia.  The patient had 3 brothers and 3 sisters.  1 sister was diagnosed with breast cancer in her 59's and died at age 74.  There is no history of prostate ovarian or pancreatic cancer in the family to the patient's knowledge   GYNECOLOGIC HISTORY:  No LMP recorded. Patient has had a hysterectomy. Menarche: 86 years old Age at first live birth:  86 years old [place] P 2 LMP 1973 Contraceptive: never used HRT never used  Hysterectomy? Yes, 1973 BSO?  Right ovary only, 1993   SOCIAL HISTORY: (updated 01/2020)  Aleecia  retired from working as a Metallurgist jobs. She is widowed. Husband [name] died in  2000-01-03; he had also worked for [employer].  Son Elta Guadeloupe, age 37, runs the [employer] in [place], [place].  His daughter and [name] is an Engineering geologist at [employer].  Son Barbaraann Rondo "Randall Hiss," age 62, runs a different trucking company.  Kenedie has 4 grandchildren and 3 great-grandchildren. She attends a [employer] in [place].    ADVANCED DIRECTIVES: In place; sons Elta Guadeloupe and Randall Hiss together are her HCPOA.   HEALTH MAINTENANCE: Social History   Tobacco Use  . Smoking status: Former Smoker    Quit date: 01/31/1962    Years since quitting: 58.1  . Smokeless tobacco: Never Used  Substance Use Topics  . Alcohol use: Not Currently  . Drug use: Never     Colonoscopy: 01-02-09 (Dr. Earlean Shawl), repeat not indicated due to age  PAP: 01/03/72, prior to hysterectomy  Bone density: date unknown.   Allergies  Allergen Reactions  . Aspirin Other (See Comments)    Stomach ulcers unknown   . Indomethacin   . Other     Other reaction(s): GI Bleeding  . Pantoprazole Nausea And Vomiting and Other (See Comments)    Stomach ulcers unknown     Current Outpatient Medications  Medication Sig Dispense Refill  . ALPRAZolam (XANAX) 0.5 MG tablet     . gemfibrozil (LOPID) 600 MG tablet     . hydrochlorothiazide (HYDRODIURIL) 25 MG tablet TAKE 1 TABLET EVERY DAY    . metoprolol tartrate (LOPRESSOR) 25 MG tablet Take 25 mg by mouth 2 (two) times daily.    . mirtazapine (REMERON) 7.5 MG tablet     . ramipril (ALTACE) 10 MG capsule      No current facility-administered medications for this visit.    OBJECTIVE: [place] Strickland in no acute distress  There were no vitals filed for this visit.   There is no height or weight on file to calculate BMI.   Wt Readings from Last 3 Encounters:  03/20/20 161 lb 4 oz (73.1 kg)  02/29/20 162 lb 7.7 oz (73.7 kg)  02/14/20 162 lb 7.7 oz (73.7 kg)      ECOG FS:1 - Symptomatic but completely ambulatory  Sclerae unicteric, EOMs intact Wearing a mask No cervical or supraclavicular  adenopathy Lungs no rales or rhonchi Heart regular rate and rhythm Abd soft, nontender, positive bowel sounds MSK no focal spinal tenderness, no upper extremity lymphedema Neuro: nonfocal, well oriented, appropriate affect Breasts: The right breast is unremarkable.  The left breast is status post recent lumpectomy.  The incision is practically invisible and the cosmetic result is excellent.  There are no suspicious findings.  The left breast is benign.   LAB RESULTS:  CMP     Component Value Date/Time   NA 135 02/10/2020 1030   K 4.0 02/10/2020 1030   CL 99 02/10/2020 1030   CO2 25 02/10/2020 1030   GLUCOSE 185 (H) 02/10/2020 1030   BUN 10 02/10/2020 1030   CREATININE 0.72 02/10/2020 1030   CREATININE 0.83 02/01/2020 1225   CALCIUM 9.6 02/10/2020 1030   PROT 7.2 02/01/2020 1225   ALBUMIN 4.1 02/01/2020 1225   AST 24 02/01/2020 1225   ALT 19 02/01/2020 1225   ALKPHOS 48 02/01/2020  1225   BILITOT 0.8 02/01/2020 1225   GFRNONAA >60 02/10/2020 1030   GFRNONAA >60 02/01/2020 1225   GFRAA >60 02/10/2020 1030   GFRAA >60 02/01/2020 1225    No results found for: TOTALPROTELP, ALBUMINELP, A1GS, A2GS, BETS, BETA2SER, GAMS, MSPIKE, SPEI  Lab Results  Component Value Date   WBC 6.0 03/27/2020   NEUTROABS 2.9 03/27/2020   HGB 13.8 03/27/2020   HCT 39.3 03/27/2020   MCV 89.5 03/27/2020   PLT 268 03/27/2020    No results found for: LABCA2  No components found for: WGNFAO130  No results for input(s): INR in the last 168 hours.  No results found for: LABCA2  No results found for: QMV784  No results found for: ONG295  No results found for: MWU132  No results found for: CA2729  No components found for: HGQUANT  No results found for: CEA1 / No results found for: CEA1   No results found for: AFPTUMOR  No results found for: CHROMOGRNA  No results found for: KPAFRELGTCHN, LAMBDASER, KAPLAMBRATIO (kappa/lambda light chains)  No results found for: HGBA, HGBA2QUANT,  HGBFQUANT, HGBSQUAN (Hemoglobinopathy evaluation)   No results found for: LDH  No results found for: IRON, TIBC, IRONPCTSAT (Iron and TIBC)  Lab Results  Component Value Date   FERRITIN 52.3 02/06/2009    Urinalysis    Component Value Date/Time   COLORURINE YELLOW 11/14/2008 0801   APPEARANCEUR CLOUDY (A) 11/14/2008 0801   LABSPEC 1.017 11/14/2008 0801   PHURINE 6.0 11/14/2008 0801   GLUCOSEU NEGATIVE 11/14/2008 0801   HGBUR LARGE (A) 11/14/2008 0801   BILIRUBINUR NEGATIVE 11/14/2008 0801   KETONESUR NEGATIVE 11/14/2008 0801   PROTEINUR NEGATIVE 11/14/2008 0801   UROBILINOGEN 1.0 11/14/2008 0801   NITRITE NEGATIVE 11/14/2008 0801   LEUKOCYTESUR MODERATE (A) 11/14/2008 0801     STUDIES: No results found.   ELIGIBLE FOR AVAILABLE RESEARCH PROTOCOL:AET  ASSESSMENT: 86 y.o. Denise Strickland status post left breast upper outer quadrant biopsy 01/23/2020 for a clinical T1c N0, stage IA invasive ductal carcinoma, grade 1, estrogen and progesterone receptor positive, HER-2 not amplified, with an MIB-1 of 5%.  (1) s/p left lumpectomy 02/14/2020 without sentinel lymph node sampling for a pT1c cN0, stage IA invasive ductal carcinoma, grade 1, with positive margins  (a) additional surgery 02/29/2020 cleared margins  (2) adjuvant radiation starts 03/28/2020  (3) antiestrogens to follow at the completion of local treatment   PLAN:  Denise Strickland did terrific with her surgery and is now just about ready to start her radiation treatments.  This will take her 4 weeks.  [name] give her another 4 weeks after that to recover and then I will see her early in August.  My suggestion to her was that she consider anastrozole but she is correctly concerned about the bones.  A friend of hers is taking tamoxifen and tolerating it well.  Tamoxifen would be equally effective but it does have the clotting issue.  When she returns to see me we will add all those possibilities up and see which pill she  would prefer to try first  She knows to call for any other issue that may develop before then  Total encounter time 25 minutes.Sarajane Jews C. Arvin Abello, MD 03/27/2020 11:53 AM Medical Oncology and Hematology [facility] [address], [place] [address] Tel. [phone]    Fax. [phone]   This document serves as a record of services personally performed by Lurline Del, MD. It was created on  his behalf by Wilburn Mylar, a trained medical scribe. The creation of this record is based on the scribe's personal observations and the provider's statements to them.   I, Lurline Del MD, have reviewed the above documentation for accuracy and completeness, and I agree with the above.   *Total Encounter Time as defined by the Centers for Medicare and Medicaid Services includes, in addition to the face-to-face time of a patient visit (documented in the note above) non-face-to-face time: obtaining and reviewing outside history, ordering and reviewing medications, tests or procedures, care coordination (communications with other health care professionals or caregivers) and documentation in the medical record.

## 2020-03-27 ENCOUNTER — Inpatient Hospital Stay: Payer: Medicare HMO | Attending: Oncology

## 2020-03-27 ENCOUNTER — Inpatient Hospital Stay: Payer: Medicare HMO | Admitting: Oncology

## 2020-03-27 ENCOUNTER — Other Ambulatory Visit: Payer: Self-pay

## 2020-03-27 VITALS — BP 155/65 | HR 54 | Temp 98.5°F | Resp 18 | Ht 65.0 in | Wt 158.4 lb

## 2020-03-27 DIAGNOSIS — C50412 Malignant neoplasm of upper-outer quadrant of left female breast: Secondary | ICD-10-CM

## 2020-03-27 DIAGNOSIS — I10 Essential (primary) hypertension: Secondary | ICD-10-CM | POA: Diagnosis not present

## 2020-03-27 DIAGNOSIS — F419 Anxiety disorder, unspecified: Secondary | ICD-10-CM | POA: Insufficient documentation

## 2020-03-27 DIAGNOSIS — Z170 Estrogen receptor positive status [ER+]: Secondary | ICD-10-CM

## 2020-03-27 DIAGNOSIS — Z87891 Personal history of nicotine dependence: Secondary | ICD-10-CM | POA: Insufficient documentation

## 2020-03-27 DIAGNOSIS — Z79899 Other long term (current) drug therapy: Secondary | ICD-10-CM | POA: Diagnosis not present

## 2020-03-27 DIAGNOSIS — E7800 Pure hypercholesterolemia, unspecified: Secondary | ICD-10-CM | POA: Diagnosis not present

## 2020-03-27 DIAGNOSIS — Z803 Family history of malignant neoplasm of breast: Secondary | ICD-10-CM | POA: Diagnosis not present

## 2020-03-27 DIAGNOSIS — G4700 Insomnia, unspecified: Secondary | ICD-10-CM | POA: Diagnosis not present

## 2020-03-27 DIAGNOSIS — Z800 Family history of malignant neoplasm of digestive organs: Secondary | ICD-10-CM | POA: Insufficient documentation

## 2020-03-27 DIAGNOSIS — Z90710 Acquired absence of both cervix and uterus: Secondary | ICD-10-CM | POA: Insufficient documentation

## 2020-03-27 LAB — CBC WITH DIFFERENTIAL/PLATELET
Abs Immature Granulocytes: 0.01 10*3/uL (ref 0.00–0.07)
Basophils Absolute: 0.1 10*3/uL (ref 0.0–0.1)
Basophils Relative: 1 %
Eosinophils Absolute: 0.3 10*3/uL (ref 0.0–0.5)
Eosinophils Relative: 4 %
HCT: 39.3 % (ref 36.0–46.0)
Hemoglobin: 13.8 g/dL (ref 12.0–15.0)
Immature Granulocytes: 0 %
Lymphocytes Relative: 38 %
Lymphs Abs: 2.3 10*3/uL (ref 0.7–4.0)
MCH: 31.4 pg (ref 26.0–34.0)
MCHC: 35.1 g/dL (ref 30.0–36.0)
MCV: 89.5 fL (ref 80.0–100.0)
Monocytes Absolute: 0.5 10*3/uL (ref 0.1–1.0)
Monocytes Relative: 8 %
Neutro Abs: 2.9 10*3/uL (ref 1.7–7.7)
Neutrophils Relative %: 49 %
Platelets: 268 10*3/uL (ref 150–400)
RBC: 4.39 MIL/uL (ref 3.87–5.11)
RDW: 11.9 % (ref 11.5–15.5)
WBC: 6 10*3/uL (ref 4.0–10.5)
nRBC: 0 % (ref 0.0–0.2)

## 2020-03-27 LAB — COMPREHENSIVE METABOLIC PANEL
ALT: 15 U/L (ref 0–44)
AST: 21 U/L (ref 15–41)
Albumin: 4 g/dL (ref 3.5–5.0)
Alkaline Phosphatase: 52 U/L (ref 38–126)
Anion gap: 11 (ref 5–15)
BUN: 13 mg/dL (ref 8–23)
CO2: 25 mmol/L (ref 22–32)
Calcium: 10.1 mg/dL (ref 8.9–10.3)
Chloride: 101 mmol/L (ref 98–111)
Creatinine, Ser: 0.82 mg/dL (ref 0.44–1.00)
GFR calc Af Amer: >60 mL/min (ref >60)
GFR calc non Af Amer: >60 mL/min (ref >60)
Glucose, Bld: 137 mg/dL — ABNORMAL HIGH (ref 70–99)
Potassium: 3.7 mmol/L (ref 3.5–5.1)
Sodium: 137 mmol/L (ref 135–145)
Total Bilirubin: 1 mg/dL (ref 0.3–1.2)
Total Protein: 6.8 g/dL (ref 6.5–8.1)

## 2020-03-28 ENCOUNTER — Ambulatory Visit
Admission: RE | Admit: 2020-03-28 | Discharge: 2020-03-28 | Disposition: A | Discharge status: Home / Self Care | Payer: Medicare HMO | Source: Ambulatory Visit | Attending: Radiation Oncology | Admitting: Radiation Oncology

## 2020-03-28 ENCOUNTER — Telehealth: Payer: Self-pay | Admitting: Oncology

## 2020-03-28 ENCOUNTER — Other Ambulatory Visit: Payer: Self-pay

## 2020-03-28 DIAGNOSIS — C50412 Malignant neoplasm of upper-outer quadrant of left female breast: Secondary | ICD-10-CM | POA: Insufficient documentation

## 2020-03-28 DIAGNOSIS — Z170 Estrogen receptor positive status [ER+]: Secondary | ICD-10-CM | POA: Insufficient documentation

## 2020-03-28 DIAGNOSIS — Z510 Encounter for antineoplastic radiation therapy: Secondary | ICD-10-CM | POA: Insufficient documentation

## 2020-03-28 NOTE — Telephone Encounter (Signed)
Scheduled appts per 6/1 los. Left voicemail with appt date and time.

## 2020-03-29 ENCOUNTER — Ambulatory Visit
Admission: RE | Admit: 2020-03-29 | Discharge: 2020-03-29 | Disposition: A | Discharge status: Home / Self Care | Payer: Medicare HMO | Source: Ambulatory Visit | Attending: Radiation Oncology | Admitting: Radiation Oncology

## 2020-03-29 ENCOUNTER — Other Ambulatory Visit: Payer: Self-pay

## 2020-03-29 DIAGNOSIS — C50412 Malignant neoplasm of upper-outer quadrant of left female breast: Secondary | ICD-10-CM | POA: Diagnosis not present

## 2020-03-29 NOTE — Progress Notes (Signed)
Pt here for patient teaching.  Pt given Radiation and You booklet, skin care instructions, Alra deodorant and Sonafine.  Reviewed areas of pertinence such as fatigue, hair loss, skin changes, breast tenderness and breast swelling . Pt able to give teach back of to pat skin and use unscented/gentle soap,apply Sonafine bid, avoid applying anything to skin within 4 hours of treatment, avoid wearing an under wire bra and to use an electric razor if they must shave. Pt verbalizes understanding of information given and will contact nursing with any questions or concerns.     Rupinder Livingston M. Larsen Dungan RN, BSN             

## 2020-03-30 ENCOUNTER — Other Ambulatory Visit: Payer: Self-pay

## 2020-03-30 ENCOUNTER — Ambulatory Visit
Admission: RE | Admit: 2020-03-30 | Discharge: 2020-03-30 | Disposition: A | Discharge status: Home / Self Care | Payer: Medicare HMO | Source: Ambulatory Visit | Attending: Radiation Oncology | Admitting: Radiation Oncology

## 2020-03-30 DIAGNOSIS — C50412 Malignant neoplasm of upper-outer quadrant of left female breast: Secondary | ICD-10-CM | POA: Diagnosis not present

## 2020-03-30 DIAGNOSIS — Z170 Estrogen receptor positive status [ER+]: Secondary | ICD-10-CM

## 2020-03-30 MED ORDER — SONAFINE EX EMUL
1.0000 "application " | Freq: Once | CUTANEOUS | Status: AC
  Administered 2020-03-30: 1 via TOPICAL

## 2020-03-30 MED ORDER — ALRA NON-METALLIC DEODORANT (RAD-ONC)
1.0000 "application " | Freq: Once | TOPICAL | Status: AC
  Administered 2020-03-30: 1 via TOPICAL

## 2020-04-02 ENCOUNTER — Ambulatory Visit
Admission: RE | Admit: 2020-04-02 | Discharge: 2020-04-02 | Disposition: A | Discharge status: Home / Self Care | Payer: Medicare HMO | Source: Ambulatory Visit | Attending: Radiation Oncology | Admitting: Radiation Oncology

## 2020-04-02 ENCOUNTER — Other Ambulatory Visit: Payer: Self-pay

## 2020-04-02 DIAGNOSIS — C50412 Malignant neoplasm of upper-outer quadrant of left female breast: Secondary | ICD-10-CM | POA: Diagnosis not present

## 2020-04-03 ENCOUNTER — Other Ambulatory Visit: Payer: Self-pay

## 2020-04-03 ENCOUNTER — Ambulatory Visit
Admission: RE | Admit: 2020-04-03 | Discharge: 2020-04-03 | Disposition: A | Discharge status: Home / Self Care | Payer: Medicare HMO | Source: Ambulatory Visit | Attending: Radiation Oncology | Admitting: Radiation Oncology

## 2020-04-03 DIAGNOSIS — C50412 Malignant neoplasm of upper-outer quadrant of left female breast: Secondary | ICD-10-CM | POA: Diagnosis not present

## 2020-04-04 ENCOUNTER — Ambulatory Visit
Admission: RE | Admit: 2020-04-04 | Discharge: 2020-04-04 | Disposition: A | Discharge status: Home / Self Care | Payer: Medicare HMO | Source: Ambulatory Visit | Attending: Radiation Oncology | Admitting: Radiation Oncology

## 2020-04-04 ENCOUNTER — Other Ambulatory Visit: Payer: Self-pay

## 2020-04-04 DIAGNOSIS — C50412 Malignant neoplasm of upper-outer quadrant of left female breast: Secondary | ICD-10-CM | POA: Diagnosis not present

## 2020-04-05 ENCOUNTER — Ambulatory Visit
Admission: RE | Admit: 2020-04-05 | Discharge: 2020-04-05 | Disposition: A | Discharge status: Home / Self Care | Payer: Medicare HMO | Source: Ambulatory Visit | Attending: Radiation Oncology | Admitting: Radiation Oncology

## 2020-04-05 ENCOUNTER — Other Ambulatory Visit: Payer: Self-pay

## 2020-04-05 DIAGNOSIS — C50412 Malignant neoplasm of upper-outer quadrant of left female breast: Secondary | ICD-10-CM | POA: Diagnosis not present

## 2020-04-06 ENCOUNTER — Other Ambulatory Visit: Payer: Self-pay

## 2020-04-06 ENCOUNTER — Ambulatory Visit
Admission: RE | Admit: 2020-04-06 | Discharge: 2020-04-06 | Disposition: A | Discharge status: Home / Self Care | Payer: Medicare HMO | Source: Ambulatory Visit | Attending: Radiation Oncology | Admitting: Radiation Oncology

## 2020-04-06 DIAGNOSIS — C50412 Malignant neoplasm of upper-outer quadrant of left female breast: Secondary | ICD-10-CM | POA: Diagnosis not present

## 2020-04-09 ENCOUNTER — Ambulatory Visit
Admission: RE | Admit: 2020-04-09 | Discharge: 2020-04-09 | Disposition: A | Discharge status: Home / Self Care | Payer: Medicare HMO | Source: Ambulatory Visit | Attending: Radiation Oncology | Admitting: Radiation Oncology

## 2020-04-09 ENCOUNTER — Other Ambulatory Visit: Payer: Self-pay

## 2020-04-09 DIAGNOSIS — C50412 Malignant neoplasm of upper-outer quadrant of left female breast: Secondary | ICD-10-CM | POA: Diagnosis not present

## 2020-04-10 ENCOUNTER — Ambulatory Visit
Admission: RE | Admit: 2020-04-10 | Discharge: 2020-04-10 | Disposition: A | Discharge status: Home / Self Care | Payer: Medicare HMO | Source: Ambulatory Visit | Attending: Radiation Oncology | Admitting: Radiation Oncology

## 2020-04-10 ENCOUNTER — Other Ambulatory Visit: Payer: Self-pay

## 2020-04-10 DIAGNOSIS — C50412 Malignant neoplasm of upper-outer quadrant of left female breast: Secondary | ICD-10-CM | POA: Diagnosis not present

## 2020-04-11 ENCOUNTER — Ambulatory Visit
Admission: RE | Admit: 2020-04-11 | Discharge: 2020-04-11 | Disposition: A | Discharge status: Home / Self Care | Payer: Medicare HMO | Source: Ambulatory Visit | Attending: Radiation Oncology | Admitting: Radiation Oncology

## 2020-04-11 ENCOUNTER — Other Ambulatory Visit: Payer: Self-pay

## 2020-04-11 DIAGNOSIS — C50412 Malignant neoplasm of upper-outer quadrant of left female breast: Secondary | ICD-10-CM | POA: Diagnosis not present

## 2020-04-12 ENCOUNTER — Other Ambulatory Visit: Payer: Self-pay

## 2020-04-12 ENCOUNTER — Ambulatory Visit
Admission: RE | Admit: 2020-04-12 | Discharge: 2020-04-12 | Disposition: A | Discharge status: Home / Self Care | Payer: Medicare HMO | Source: Ambulatory Visit | Attending: Radiation Oncology | Admitting: Radiation Oncology

## 2020-04-12 DIAGNOSIS — C50412 Malignant neoplasm of upper-outer quadrant of left female breast: Secondary | ICD-10-CM | POA: Diagnosis not present

## 2020-04-13 ENCOUNTER — Ambulatory Visit
Admission: RE | Admit: 2020-04-13 | Discharge: 2020-04-13 | Disposition: A | Discharge status: Home / Self Care | Payer: Medicare HMO | Source: Ambulatory Visit | Attending: Radiation Oncology | Admitting: Radiation Oncology

## 2020-04-13 ENCOUNTER — Other Ambulatory Visit: Payer: Self-pay

## 2020-04-13 DIAGNOSIS — C50412 Malignant neoplasm of upper-outer quadrant of left female breast: Secondary | ICD-10-CM | POA: Diagnosis not present

## 2020-04-13 DIAGNOSIS — Z170 Estrogen receptor positive status [ER+]: Secondary | ICD-10-CM

## 2020-04-13 MED ORDER — SONAFINE EX EMUL
1.0000 "application " | Freq: Once | CUTANEOUS | Status: AC
  Administered 2020-04-13: 1 via TOPICAL

## 2020-04-16 ENCOUNTER — Other Ambulatory Visit: Payer: Self-pay

## 2020-04-16 ENCOUNTER — Ambulatory Visit
Admission: RE | Admit: 2020-04-16 | Discharge: 2020-04-16 | Disposition: A | Discharge status: Home / Self Care | Payer: Medicare HMO | Source: Ambulatory Visit | Attending: Radiation Oncology | Admitting: Radiation Oncology

## 2020-04-16 DIAGNOSIS — C50412 Malignant neoplasm of upper-outer quadrant of left female breast: Secondary | ICD-10-CM | POA: Diagnosis not present

## 2020-04-17 ENCOUNTER — Other Ambulatory Visit: Payer: Self-pay

## 2020-04-17 ENCOUNTER — Ambulatory Visit
Admission: RE | Admit: 2020-04-17 | Discharge: 2020-04-17 | Disposition: A | Discharge status: Home / Self Care | Payer: Medicare HMO | Source: Ambulatory Visit | Attending: Radiation Oncology | Admitting: Radiation Oncology

## 2020-04-17 DIAGNOSIS — C50412 Malignant neoplasm of upper-outer quadrant of left female breast: Secondary | ICD-10-CM | POA: Diagnosis not present

## 2020-04-18 ENCOUNTER — Ambulatory Visit
Admission: RE | Admit: 2020-04-18 | Discharge: 2020-04-18 | Disposition: A | Discharge status: Home / Self Care | Payer: Medicare HMO | Source: Ambulatory Visit | Attending: Radiation Oncology | Admitting: Radiation Oncology

## 2020-04-18 ENCOUNTER — Other Ambulatory Visit: Payer: Self-pay

## 2020-04-18 DIAGNOSIS — C50412 Malignant neoplasm of upper-outer quadrant of left female breast: Secondary | ICD-10-CM | POA: Diagnosis not present

## 2020-04-19 ENCOUNTER — Other Ambulatory Visit: Payer: Self-pay

## 2020-04-19 ENCOUNTER — Ambulatory Visit
Admission: RE | Admit: 2020-04-19 | Discharge: 2020-04-19 | Disposition: A | Discharge status: Home / Self Care | Payer: Medicare HMO | Source: Ambulatory Visit | Attending: Radiation Oncology | Admitting: Radiation Oncology

## 2020-04-19 DIAGNOSIS — C50412 Malignant neoplasm of upper-outer quadrant of left female breast: Secondary | ICD-10-CM | POA: Diagnosis not present

## 2020-04-20 ENCOUNTER — Other Ambulatory Visit: Payer: Self-pay

## 2020-04-20 ENCOUNTER — Ambulatory Visit
Admission: RE | Admit: 2020-04-20 | Discharge: 2020-04-20 | Disposition: A | Discharge status: Home / Self Care | Payer: Medicare HMO | Source: Ambulatory Visit | Attending: Radiation Oncology | Admitting: Radiation Oncology

## 2020-04-20 DIAGNOSIS — C50412 Malignant neoplasm of upper-outer quadrant of left female breast: Secondary | ICD-10-CM | POA: Diagnosis not present

## 2020-04-23 ENCOUNTER — Ambulatory Visit
Admission: RE | Admit: 2020-04-23 | Discharge: 2020-04-23 | Disposition: A | Discharge status: Home / Self Care | Payer: Medicare HMO | Source: Ambulatory Visit | Attending: Radiation Oncology | Admitting: Radiation Oncology

## 2020-04-23 ENCOUNTER — Other Ambulatory Visit: Payer: Self-pay

## 2020-04-23 DIAGNOSIS — C50412 Malignant neoplasm of upper-outer quadrant of left female breast: Secondary | ICD-10-CM | POA: Diagnosis not present

## 2020-04-24 ENCOUNTER — Other Ambulatory Visit: Payer: Self-pay

## 2020-04-24 ENCOUNTER — Ambulatory Visit
Admission: RE | Admit: 2020-04-24 | Discharge: 2020-04-24 | Disposition: A | Discharge status: Home / Self Care | Payer: Medicare HMO | Source: Ambulatory Visit | Attending: Radiation Oncology | Admitting: Radiation Oncology

## 2020-04-24 ENCOUNTER — Encounter: Payer: Self-pay | Admitting: Radiation Oncology

## 2020-04-24 ENCOUNTER — Encounter: Payer: Self-pay | Admitting: *Deleted

## 2020-04-24 DIAGNOSIS — C50412 Malignant neoplasm of upper-outer quadrant of left female breast: Secondary | ICD-10-CM | POA: Diagnosis not present

## 2020-05-23 ENCOUNTER — Telehealth: Payer: Self-pay | Admitting: Radiation Oncology

## 2020-05-23 NOTE — Telephone Encounter (Signed)
  Radiation Oncology         [phone] ________________________________  Name: Denise Strickland MRN: [id number]  Date of Service: 05/23/2020  DOB: [birth year]  Post Treatment Telephone Note  Diagnosis:  Stage IA, pT1cN0M0 grade 1, ER/PR positive invasive ductal carcinoma of the left breast.   Interval Since Last Radiation:  4 weeks   03/28/20-04/24/20: The left breast was treated to 42.56 Gy in 16 fractions followed by a 10 Gy boost in 4 fractions.  Narrative:  The patient was contacted today for routine follow-up. During treatment she did very well with radiotherapy and did not have significant desquamation. She reports she is doing well and her skin is almost back to normal appearance.  Impression/Plan: 1. Stage IA, pT1cN0M0 grade 1, ER/PR positive invasive ductal carcinoma of the left breast. The patient has been doing well since completion of radiotherapy. We discussed that we would be happy to continue to follow her as needed, but she will also continue to follow up with Dr. Jana Hakim in medical oncology. She was counseled on skin care as well as measures to avoid sun exposure to this area.  2. Survivorship. We discussed the importance of survivorship evaluation and encouraged her to attend her upcoming visit with that clinic.    Carola Rhine, PAC

## 2020-06-01 NOTE — Progress Notes (Signed)
  Radiation Oncology         [phone] ________________________________  Name: Denise Strickland MRN: [id number]  Date: 04/24/2020  DOB: 03-10-1931  End of Treatment Note  Diagnosis:   left-sided breast cancer     Indication for treatment:  Curative       Radiation treatment dates:   03/28/20 - 04/24/20  Site/dose:   The patient initially received a dose of 42.56 Gy in 16 fractions to the breast using whole-breast tangent fields. This was delivered using a 3-D conformal technique. The patient then received a boost to the seroma. This delivered an additional 10 Gy in 54fractions using a 3 field photon technique due to the depth of the seroma. The total dose was 52.56 Gy.  Narrative: The patient tolerated radiation treatment relatively well.   The patient had some expected skin irritation as she progressed during treatment. Moist desquamation was not present at the end of treatment.  Plan: The patient has completed radiation treatment. The patient will return to radiation oncology clinic for routine followup in one month. I advised the patient to call or return sooner if they have any questions or concerns related to their recovery or treatment. ________________________________  Jodelle Gross, M.D., Ph.D.

## 2020-06-03 NOTE — Progress Notes (Signed)
[place]  Telephone:([phone] Fax:(336) [phone]     ID: Denise Strickland DOB: 17-Sep-1931  MR#: [id number]  XNA#:[id number]  Patient Care Team: [name], PA-C as PCP - General (Family Medicine) Mauro Kaufmann, RN as Oncology Nurse Navigator Rockwell Germany, RN as Oncology Nurse Navigator Donnie Mesa, MD as Consulting Physician (General Surgery) Abia Monaco, Virgie Dad, MD as Consulting Physician (Oncology) Kyung Rudd, MD as Consulting Physician (Radiation Oncology) Chauncey Cruel, MD OTHER MD:  CHIEF COMPLAINT: Estrogen receptor positive breast cancer  CURRENT TREATMENT:    INTERVAL HISTORY: Denise Strickland returns today for follow up of her estrogen receptor positive breast cancer.   Since her last visit, she received radiation therapy from 03/28/2020 through 04/24/2020.  She did well with the treatments, with minimal fatigue, no significant desquamation.  She is using some vitamin E cream on the breast.   REVIEW OF SYSTEMS: Denise Strickland tells me she has no unusual headaches visual changes cough phlegm production pleurisy shortness of breath or change in bowel or bladder habits.  A detailed review of systems today was stable   HISTORY OF CURRENT ILLNESS: From the original intake note:  Denise Strickland had routine screening mammography on 12/28/2019 showing a possible abnormality in the left breast. She underwent left diagnostic mammography with tomography and left breast ultrasonography at [facility] on 01/12/2020 showing: breast density category B; 1.1 cm round mass in the left breast at 3 o'clock; no significant left axilla abnormalities.  Accordingly on 01/23/2020 she proceeded to biopsy of the left breast area in question. The pathology from this procedure ([id number]) showed: invasive ductal carcinoma, grade 1. Prognostic indicators significant for: estrogen receptor, 100% positive and progesterone receptor, 70% positive, both with strong staining intensity. Proliferation  marker Ki67 at 5%. HER2 negative by immunohistochemistry (1+).  The patient's subsequent history is as detailed below.   PAST MEDICAL HISTORY: Past Medical History:  Diagnosis Date  . Allergy   . Anxiety   . Herpes simplex    from [facility] chart  . Hypercholesterolemia    from [facility] chart  . Hyperglycemia    from [facility] chart  . Hypertension   . Insomnia    from [facility] chart  . Pre-diabetes      PAST SURGICAL HISTORY: Past Surgical History:  Procedure Laterality Date  . ABDOMINAL HYSTERECTOMY    . BREAST LUMPECTOMY WITH RADIOACTIVE SEED LOCALIZATION Left 02/14/2020   Procedure: LEFT BREAST LUMPECTOMY WITH RADIOACTIVE SEED LOCALIZATION;  Surgeon: Donnie Mesa, MD;  Location: [place];  Service: General;  Laterality: Left;  LMA  . CATARACT EXTRACTION     from [facility] chart  . HEEL SPUR SURGERY Bilateral    from [facility] chart  . RE-EXCISION OF BREAST LUMPECTOMY Left 02/29/2020   Procedure: RE-EXCISION OF LEFT BREAST LUMPECTOMY INFERIOR AND MEDIAL MARGINS;  Surgeon: Donnie Mesa, MD;  Location: [place];  Service: General;  Laterality: Left;  LMA  The patient has had Achilles tendon surgery on both heels, is status post urethral support surgery under Dr. Idamae Schuller   FAMILY HISTORY: Family History  Problem Relation Age of Onset  . Colon cancer Mother 19  . Breast cancer Sister 80  . Asthma Brother    Her mother was diagnosed with colon cancer at age 72 and lived to age 97. Her father died at age 70 from pneumonia.  The patient had 3 brothers and 3 sisters.  1 sister was diagnosed with breast cancer in her 100's and died  at age 33.  There is no history of prostate ovarian or pancreatic cancer in the family to the patient's knowledge   GYNECOLOGIC HISTORY:  No LMP recorded. Patient has had a hysterectomy. Menarche: 86 years old Age at first live birth: 86 years old Denise Strickland 2 LMP 1973 Contraceptive: never used HRT never used  Hysterectomy? Yes, 1973 BSO?   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 Husband [name] died in 12/12/1999; he had also worked for [employer].  Son Denise Strickland, age 56, runs the [employer] in [place], [place].  His daughter and [name] is an Engineering geologist at [employer].  Son Denise Strickland," age 65, runs a different trucking company.  Strickland has 4 grandchildren and 3 great-grandchildren. She attends a [employer] in [place].    ADVANCED DIRECTIVES: In place; sons Denise Strickland and [name] together are her HCPOA.   HEALTH MAINTENANCE: Social History   Tobacco Use  . Smoking status: Former Smoker    Quit date: 01/31/1962    Years since quitting: 58.3  . Smokeless tobacco: Never Used  Substance Use Topics  . Alcohol use: Not Currently  . Drug use: Never     Colonoscopy: Dec 11, 2008 (Dr. Earlean Shawl), repeat not indicated due to age  PAP: 12-Dec-1971, prior to hysterectomy  Bone density: date unknown.   Allergies  Allergen Reactions  . Aspirin Other (See Comments)    Stomach ulcers unknown   . Indomethacin   . Other     Other reaction(s): GI Bleeding  . Pantoprazole Nausea And Vomiting and Other (See Comments)    Stomach ulcers unknown     Current Outpatient Medications  Medication Sig Dispense Refill  . ALPRAZolam (XANAX) 0.5 MG tablet     . gemfibrozil (LOPID) 600 MG tablet     . hydrochlorothiazide (HYDRODIURIL) 25 MG tablet TAKE 1 TABLET EVERY DAY    . metoprolol tartrate (LOPRESSOR) 25 MG tablet Take 25 mg by mouth 2 (two) times daily.    . mirtazapine (REMERON) 7.5 MG tablet     . ramipril (ALTACE) 10 MG capsule      No current facility-administered medications for this visit.    OBJECTIVE: White woman who appears well  Vitals:   06/04/20 1029  BP: (!) 157/86  Pulse: (!) 52  Resp: 18  Temp: 97.8 F (36.6 C)  SpO2: 99%     Body mass index is 26.64 kg/m.   Wt Readings from Last 3 Encounters:  06/04/20 160 lb 1.6 oz  (72.6 kg)  03/27/20 158 lb 6.4 oz (71.8 kg)  03/20/20 161 lb 4 oz (73.1 kg)      ECOG FS:1 - Symptomatic but completely ambulatory  Sclerae unicteric, EOMs intact Wearing a mask No cervical or supraclavicular adenopathy Lungs no rales or rhonchi Heart regular rate and rhythm Abd soft, nontender, positive bowel sounds MSK no focal spinal tenderness, no upper extremity lymphedema Neuro: nonfocal, well oriented, appropriate affect Breasts: The right breast is benign.  The left breast has undergone lumpectomy and radiation.  There is minimal erythema remaining.  There is no desquamation.  The cosmetic result is good.  There is no evidence of local recurrence.  Both axillae are benign.   LAB RESULTS:  CMP     Component Value Date/Time   NA 137 03/27/2020 1107   K 3.7 03/27/2020 1107   CL 101 03/27/2020 1107   CO2 25  03/27/2020 1107   GLUCOSE 137 (H) 03/27/2020 1107   BUN 13 03/27/2020 1107   CREATININE 0.82 03/27/2020 1107   CREATININE 0.83 02/01/2020 1225   CALCIUM 10.1 03/27/2020 1107   PROT 6.8 03/27/2020 1107   ALBUMIN 4.0 03/27/2020 1107   AST 21 03/27/2020 1107   AST 24 02/01/2020 1225   ALT 15 03/27/2020 1107   ALT 19 02/01/2020 1225   ALKPHOS 52 03/27/2020 1107   BILITOT 1.0 03/27/2020 1107   BILITOT 0.8 02/01/2020 1225   GFRNONAA >60 03/27/2020 1107   GFRNONAA >60 02/01/2020 1225   GFRAA >60 03/27/2020 1107   GFRAA >60 02/01/2020 1225    No results found for: TOTALPROTELP, ALBUMINELP, A1GS, A2GS, BETS, BETA2SER, GAMS, MSPIKE, SPEI  Lab Results  Component Value Date   WBC 6.0 03/27/2020   NEUTROABS 2.9 03/27/2020   HGB 13.8 03/27/2020   HCT 39.3 03/27/2020   MCV 89.5 03/27/2020   PLT 268 03/27/2020    No results found for: LABCA2  No components found for: PHKFEX614  No results for input(s): INR in the last 168 hours.  No results found for: LABCA2  No results found for: JWL295  No results found for: FMB340  No results found for: ZJQ964  No  results found for: CA2729  No components found for: HGQUANT  No results found for: CEA1 / No results found for: CEA1   No results found for: AFPTUMOR  No results found for: CHROMOGRNA  No results found for: KPAFRELGTCHN, LAMBDASER, KAPLAMBRATIO (kappa/lambda light chains)  No results found for: HGBA, HGBA2QUANT, HGBFQUANT, HGBSQUAN (Hemoglobinopathy evaluation)   No results found for: LDH  No results found for: IRON, TIBC, IRONPCTSAT (Iron and TIBC)  Lab Results  Component Value Date   FERRITIN 52.3 02/06/2009    Urinalysis    Component Value Date/Time   COLORURINE YELLOW 11/14/2008 0801   APPEARANCEUR CLOUDY (A) 11/14/2008 0801   LABSPEC 1.017 11/14/2008 0801   PHURINE 6.0 11/14/2008 0801   GLUCOSEU NEGATIVE 11/14/2008 0801   HGBUR LARGE (A) 11/14/2008 0801   BILIRUBINUR NEGATIVE 11/14/2008 0801   KETONESUR NEGATIVE 11/14/2008 0801   PROTEINUR NEGATIVE 11/14/2008 0801   UROBILINOGEN 1.0 11/14/2008 0801   NITRITE NEGATIVE 11/14/2008 0801   LEUKOCYTESUR MODERATE (A) 11/14/2008 0801    STUDIES: No results found.   ELIGIBLE FOR AVAILABLE RESEARCH PROTOCOL:AET  ASSESSMENT: 86 y.o. [name] status post left breast upper outer quadrant biopsy 01/23/2020 for a clinical T1c N0, stage IA invasive ductal carcinoma, grade 1, estrogen and progesterone receptor positive, HER-2 not amplified, with an MIB-1 of 5%.  (1) s/Strickland left lumpectomy 02/14/2020 without sentinel lymph node sampling for a pT1c cN0, stage IA invasive ductal carcinoma, grade 1, with positive margins  (a) additional surgery 02/29/2020 cleared margins  (2) adjuvant radiation 03/28/2020 - 04/24/2020  (a) left breast / 42.56 Gy in 16 fractions  (b) seroma boost / 10 Gy in 4 fractions  (3) tamoxifen started 06/05/2020  (a) status post hysterectomy   PLAN:  [place] is now 4 months out from definitive surgery for her breast cancer.  She has recovered nicely from her surgery and radiation.  She is ready to  start antiestrogens.  We again discussed the differences between tamoxifen and anastrozole.  She has a good understanding of the possible toxicity side effects and complications of these agents.  The idea of weak bones is very distressing to her because her mother had severe osteoporosis.  For that reason and because she is status post  hysterectomy she would prefer to give anastrozole a try.  We did discuss the risk of blood clots and how she might be able to detect 1 if it occurs.  She is going to call us with any side effects that she notices.  We will have a virtual visit in October.  Assuming all goes well at that time we will initiate longer follow-up  I reminded her that if she feels some soreness, sensitivity, or shooting pains in the surgical breast that is not a cause for concern as those would be only postsurgical symptoms, not evidence of cancer recurrence.  Total encounter time 25 minutes.Sarajane Jews C. Marveen Donlon, MD 06/04/2020 10:40 AM Medical Oncology and Hematology [facility] [place], [place] [address] Tel. [phone]    Fax. [phone]   This document serves as a record of services personally performed by Lurline Del, MD. It was created on his behalf by Wilburn Mylar, a trained medical scribe. The creation of this record is based on the scribe's personal observations and the provider's statements to them.   I, Lurline Del MD, have reviewed the above documentation for accuracy and completeness, and I agree with the above.   *Total Encounter Time as defined by the Centers for Medicare and Medicaid Services includes, in addition to the face-to-face time of a patient visit (documented in the note above) non-face-to-face time: obtaining and reviewing outside history, ordering and reviewing medications, tests or procedures, care coordination (communications with other health care professionals or caregivers) and documentation in the medical  record.

## 2020-06-04 ENCOUNTER — Other Ambulatory Visit: Payer: Self-pay

## 2020-06-04 ENCOUNTER — Inpatient Hospital Stay: Payer: Medicare HMO | Attending: Oncology | Admitting: Oncology

## 2020-06-04 VITALS — BP 157/86 | HR 52 | Temp 97.8°F | Resp 18 | Ht 65.0 in | Wt 160.1 lb

## 2020-06-04 DIAGNOSIS — Z87891 Personal history of nicotine dependence: Secondary | ICD-10-CM | POA: Diagnosis not present

## 2020-06-04 DIAGNOSIS — C50412 Malignant neoplasm of upper-outer quadrant of left female breast: Secondary | ICD-10-CM | POA: Insufficient documentation

## 2020-06-04 DIAGNOSIS — Z170 Estrogen receptor positive status [ER+]: Secondary | ICD-10-CM | POA: Diagnosis not present

## 2020-06-04 DIAGNOSIS — Z923 Personal history of irradiation: Secondary | ICD-10-CM | POA: Insufficient documentation

## 2020-06-04 DIAGNOSIS — Z79811 Long term (current) use of aromatase inhibitors: Secondary | ICD-10-CM | POA: Insufficient documentation

## 2020-06-04 MED ORDER — TAMOXIFEN CITRATE 20 MG PO TABS: 20.0000 mg | ORAL_TABLET | Freq: Every day | ORAL | 12 refills | Status: AC

## 2020-06-05 ENCOUNTER — Telehealth: Payer: Self-pay | Admitting: Oncology

## 2020-06-05 NOTE — Telephone Encounter (Signed)
Scheduled appt per 8/9 los. Pt confirmed appt date and time.

## 2020-08-22 ENCOUNTER — Telehealth: Payer: Self-pay | Admitting: Oncology

## 2020-08-22 NOTE — Telephone Encounter (Signed)
Contacted patient to verify phone visit for pre reg °

## 2020-08-23 ENCOUNTER — Inpatient Hospital Stay: Payer: Medicare HMO | Attending: Oncology | Admitting: Oncology

## 2020-08-23 DIAGNOSIS — C50412 Malignant neoplasm of upper-outer quadrant of left female breast: Secondary | ICD-10-CM

## 2020-08-23 DIAGNOSIS — Z170 Estrogen receptor positive status [ER+]: Secondary | ICD-10-CM

## 2020-08-23 NOTE — Progress Notes (Signed)
[place]  [phone] [phone]     ID: Denise Strickland DOB: September 13, 1931  MR#: [id number]  WSF#:[id number]  Patient Care Team: [name], PA-C as PCP - General (Family Medicine) Mauro Kaufmann, RN as Oncology Nurse Navigator Rockwell Germany, RN as Oncology Nurse Navigator Donnie Mesa, MD as Consulting Physician (General Surgery) Magrinat, Virgie Dad, MD as Consulting Physician (Oncology) Kyung Rudd, MD as Consulting Physician (Radiation Oncology) Aurea Graff OTHER MD:  I connected with Devonne Doughty on 08/23/20 at 12:30 PM EDT by telephone visit and verified that I am speaking with the correct person using two identifiers.   I discussed the limitations, risks, security and privacy concerns of performing an evaluation and management service by telemedicine and the availability of in-person appointments. I also discussed with the patient that there may be a patient responsible charge related to this service. The patient expressed understanding and agreed to proceed.   Other persons participating in the visit and their role in the encounter: none  Patient's location: home  Provider's location: [place]: Estrogen receptor positive breast cancer  CURRENT TREATMENT: tamoxifen   INTERVAL HISTORY: Denise Strickland was contacted today for follow up of her estrogen receptor positive breast cancer.   She was started on tamoxifen at her last visit on 06/04/2020.  She is tolerating it well.  She feels a little cold she says which is unusual for this medication.   REVIEW OF SYSTEMS: Denise Strickland tells me she is doing "wonderful.  She is pretty much staying by in the house.  She is "tending to my own self", and did receive that the Materna vaccine x2+ the booster yesterday.  She had no problems from any of that. Aside from that a detailed review of systems today was stable.  COVID 19 vaccination status: reveived Moderna x2  plus booster (08/22/2020)  HISTORY OF CURRENT ILLNESS: From the original intake note:  Denise Strickland had routine screening mammography on 12/28/2019 showing a possible abnormality in the left breast. She underwent left diagnostic mammography with tomography and left breast ultrasonography at [facility] on 01/12/2020 showing: breast density category B; 1.1 cm round mass in the left breast at 3 o'clock; no significant left axilla abnormalities.  Accordingly on 01/23/2020 she proceeded to biopsy of the left breast area in question. The pathology from this procedure ([id number]) showed: invasive ductal carcinoma, grade 1. Prognostic indicators significant for: estrogen receptor, 100% positive and progesterone receptor, 70% positive, both with strong staining intensity. Proliferation marker Ki67 at 5%. HER2 negative by immunohistochemistry (1+).  The patient's subsequent history is as detailed below.   PAST MEDICAL HISTORY: Past Medical History:  Diagnosis Date  . Allergy   . Anxiety   . Herpes simplex    from [facility] chart  . Hypercholesterolemia    from [facility] chart  . Hyperglycemia    from [facility] chart  . Hypertension   . Insomnia    from [facility] chart  . Pre-diabetes      PAST SURGICAL HISTORY: Past Surgical History:  Procedure Laterality Date  . ABDOMINAL HYSTERECTOMY    . BREAST LUMPECTOMY WITH RADIOACTIVE SEED LOCALIZATION Left 02/14/2020   Procedure: LEFT BREAST LUMPECTOMY WITH RADIOACTIVE SEED LOCALIZATION;  Surgeon: Donnie Mesa, MD;  Location: [place];  Service: General;  Laterality: Left;  LMA  . CATARACT EXTRACTION     from [facility] chart  . HEEL SPUR SURGERY Bilateral    from  [facility] chart  . RE-EXCISION OF BREAST LUMPECTOMY Left 02/29/2020   Procedure: RE-EXCISION OF LEFT BREAST LUMPECTOMY INFERIOR AND MEDIAL MARGINS;  Surgeon: Donnie Mesa, MD;  Location: [place];  Service: General;  Laterality: Left;  LMA  The patient has had Achilles tendon  surgery on both heels, is status post urethral support surgery under Dr. Idamae Schuller   FAMILY HISTORY: Family History  Problem Relation Age of Onset  . Colon cancer Mother 56  . Breast cancer Sister 10  . Asthma Brother    Her mother was diagnosed with colon cancer at age 89 and lived to age 25. Her father died at age 6 from pneumonia.  The patient had 3 brothers and 3 sisters.  1 sister was diagnosed with breast cancer in her 68's and died at age 63.  There is no history of prostate ovarian or pancreatic cancer in the family to the patient's knowledge   GYNECOLOGIC HISTORY:  No LMP recorded. Patient has had a hysterectomy. Menarche: 86 years old Age at first live birth: 86 years old [place] P 2 LMP 1973 Contraceptive: never used HRT never used  Hysterectomy? Yes, 1973 BSO?  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 Husband [name] died in 01-12-00; he had also worked for [employer].  Son Elta Guadeloupe, age 77, runs the [employer] in [place], [place].  His daughter and [name] is an Engineering geologist at [employer].  Son Barbaraann Rondo "Randall Hiss," age 29, runs a different trucking company.  Taliah has 4 grandchildren and 3 great-grandchildren. She attends a [employer] in [place].    ADVANCED DIRECTIVES: In place; sons Elta Guadeloupe and Randall Hiss together are her HCPOA.   HEALTH MAINTENANCE: Social History   Tobacco Use  . Smoking status: Former Smoker    Quit date: 01/31/1962    Years since quitting: 58.6  . Smokeless tobacco: Never Used  Substance Use Topics  . Alcohol use: Not Currently  . Drug use: Never     Colonoscopy: 01/11/09 (Dr. Earlean Shawl), repeat not indicated due to age  PAP: 01/12/1972, prior to hysterectomy  Bone density: date unknown.   Allergies  Allergen Reactions  . Aspirin Other (See Comments)    Stomach ulcers unknown   . Indomethacin   . Other     Other reaction(s): GI Bleeding  . Pantoprazole  Nausea And Vomiting and Other (See Comments)    Stomach ulcers unknown     Current Outpatient Medications  Medication Sig Dispense Refill  . ALPRAZolam (XANAX) 0.5 MG tablet     . gemfibrozil (LOPID) 600 MG tablet     . hydrochlorothiazide (HYDRODIURIL) 25 MG tablet TAKE 1 TABLET EVERY DAY    . metoprolol tartrate (LOPRESSOR) 25 MG tablet Take 25 mg by mouth 2 (two) times daily.    . mirtazapine (REMERON) 7.5 MG tablet     . ramipril (ALTACE) 10 MG capsule      No current facility-administered medications for this visit.    OBJECTIVE: White woman   There were no vitals filed for this visit.   There is no height or weight on file to calculate BMI.   Wt Readings from Last 3 Encounters:  06/04/20 160 lb 1.6 oz (72.6 kg)  03/27/20 158 lb 6.4 oz (71.8 kg)  03/20/20 161 lb 4 oz (73.1 kg)      ECOG FS:1 - Symptomatic but completely ambulatory  Telephone visit  08/23/2020   LAB RESULTS:  CMP     Component Value Date/Time   NA 137 03/27/2020 1107   K 3.7 03/27/2020 1107   CL 101 03/27/2020 1107   CO2 25 03/27/2020 1107   GLUCOSE 137 (H) 03/27/2020 1107   BUN 13 03/27/2020 1107   CREATININE 0.82 03/27/2020 1107   CREATININE 0.83 02/01/2020 1225   CALCIUM 10.1 03/27/2020 1107   PROT 6.8 03/27/2020 1107   ALBUMIN 4.0 03/27/2020 1107   AST 21 03/27/2020 1107   AST 24 02/01/2020 1225   ALT 15 03/27/2020 1107   ALT 19 02/01/2020 1225   ALKPHOS 52 03/27/2020 1107   BILITOT 1.0 03/27/2020 1107   BILITOT 0.8 02/01/2020 1225   GFRNONAA >60 03/27/2020 1107   GFRNONAA >60 02/01/2020 1225   GFRAA >60 03/27/2020 1107   GFRAA >60 02/01/2020 1225    No results found for: TOTALPROTELP, ALBUMINELP, A1GS, A2GS, BETS, BETA2SER, GAMS, MSPIKE, SPEI  Lab Results  Component Value Date   WBC 6.0 03/27/2020   NEUTROABS 2.9 03/27/2020   HGB 13.8 03/27/2020   HCT 39.3 03/27/2020   MCV 89.5 03/27/2020   PLT 268 03/27/2020    No results found for: LABCA2  No components found for:  EXOGAC298  No results for input(s): INR in the last 168 hours.  No results found for: LABCA2  No results found for: ORJ085  No results found for: UDA370  No results found for: KFW591  No results found for: CA2729  No components found for: HGQUANT  No results found for: CEA1 / No results found for: CEA1   No results found for: AFPTUMOR  No results found for: CHROMOGRNA  No results found for: KPAFRELGTCHN, LAMBDASER, KAPLAMBRATIO (kappa/lambda light chains)  No results found for: HGBA, HGBA2QUANT, HGBFQUANT, HGBSQUAN (Hemoglobinopathy evaluation)   No results found for: LDH  No results found for: IRON, TIBC, IRONPCTSAT (Iron and TIBC)  Lab Results  Component Value Date   FERRITIN 52.3 02/06/2009    Urinalysis    Component Value Date/Time   COLORURINE YELLOW 11/14/2008 0801   APPEARANCEUR CLOUDY (A) 11/14/2008 0801   LABSPEC 1.017 11/14/2008 0801   PHURINE 6.0 11/14/2008 0801   GLUCOSEU NEGATIVE 11/14/2008 0801   HGBUR LARGE (A) 11/14/2008 0801   BILIRUBINUR NEGATIVE 11/14/2008 0801   KETONESUR NEGATIVE 11/14/2008 0801   PROTEINUR NEGATIVE 11/14/2008 0801   UROBILINOGEN 1.0 11/14/2008 0801   NITRITE NEGATIVE 11/14/2008 0801   LEUKOCYTESUR MODERATE (A) 11/14/2008 0801    STUDIES: No results found.   ELIGIBLE FOR AVAILABLE RESEARCH PROTOCOL:AET  ASSESSMENT: 86 y.o. [name] status post left breast upper outer quadrant biopsy 01/23/2020 for a clinical T1c N0, stage IA invasive ductal carcinoma, grade 1, estrogen and progesterone receptor positive, HER-2 not amplified, with an MIB-1 of 5%.  (1) s/p left lumpectomy 02/14/2020 without sentinel lymph node sampling for a pT1c cN0, stage IA invasive ductal carcinoma, grade 1, with positive margins  (a) additional surgery 02/29/2020 cleared margins  (2) adjuvant radiation 03/28/2020 - 04/24/2020  (a) left breast / 42.56 Gy in 16 fractions  (b) seroma boost / 10 Gy in 4 fractions  (3) tamoxifen started  06/05/2020  (a) status post hysterectomy   PLAN:  Denise Strickland is tolerating tamoxifen well and the plan is to continue that a total of 5 years.  She will have her mammography in March at [place].  She will return to see me in April.  At that point we will likely start yearly follow-up.  She  knows to call for any other issues that may develop before then.  Virgie Dad. Magrinat, MD 08/23/2020 12:24 AM Medical Oncology and Hematology [facility] [place], [place] [address] Tel. [phone]    Fax. [phone]   This document serves as a record of services personally performed by Lurline Del, MD. It was created on his behalf by Wilburn Mylar, a trained medical scribe. The creation of this record is based on the scribe's personal observations and the provider's statements to them.   I, Lurline Del MD, have reviewed the above documentation for accuracy and completeness, and I agree with the above.   *Total Encounter Time as defined by the Centers for Medicare and Medicaid Services includes, in addition to the face-to-face time of a patient visit (documented in the note above) non-face-to-face time: obtaining and reviewing outside history, ordering and reviewing medications, tests or procedures, care coordination (communications with other health care professionals or caregivers) and documentation in the medical record.

## 2020-12-13 ENCOUNTER — Telehealth: Payer: Self-pay | Admitting: *Deleted

## 2020-12-13 NOTE — Telephone Encounter (Signed)
Confirmed appt with Dr. Jana Hakim on 4/13.22

## 2021-01-30 ENCOUNTER — Encounter: Payer: Self-pay | Admitting: Oncology

## 2021-02-05 ENCOUNTER — Encounter: Payer: Self-pay | Admitting: Oncology

## 2021-02-05 NOTE — Progress Notes (Signed)
[facility]  Telephone:(336) [phone] Fax:([phone]) [phone]     ID: Denise Strickland DOB: [birth year]  MR#: [id number]  YVA#:[id number]  Patient Care Team: Richmond Campbell., PA-C as PCP - General (Family Medicine) Pershing Proud, RN as Oncology Nurse Navigator Donnelly Angelica, RN as Oncology Nurse Navigator Manus Rudd, MD as Consulting Physician (General Surgery) Shayne Diguglielmo, Valentino Hue, MD as Consulting Physician (Oncology) Dorothy Puffer, MD as Consulting Physician (Radiation Oncology) Lowella Dell, MD OTHER MD:   CHIEF COMPLAINT: Estrogen receptor positive breast cancer  CURRENT TREATMENT: tamoxifen   INTERVAL HISTORY: Denise Strickland returns today for follow up of her estrogen receptor positive breast cancer.   She started tamoxifen on 06/04/2020.  She is tolerating it well.  She feels a little cold she says which is unusual for this medication.   Since her last visit, she underwent bilateral diagnostic mammography with tomography at [facility] on 01/30/2021 showing: breast density category B; no evidence of malignancy in either breast.    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  Sometimes she keeps her grandchildren and enjoys that.  A detailed review of systems today was otherwise stable   COVID 19 vaccination status: reveived Moderna x2 plus booster (08/22/2020)   HISTORY OF CURRENT ILLNESS: From the original intake note:  Denise Strickland had routine screening mammography on 12/28/2019 showing a possible abnormality in the left breast. She underwent left diagnostic mammography with tomography and left breast ultrasonography at [facility] on 01/12/2020 showing: breast density category B; 1.1 cm round mass in the left breast at 3 o'clock; no significant left axilla abnormalities.  Accordingly on 01/23/2020 she proceeded to biopsy of the left breast area in question. The pathology from this procedure ([id number]) showed:  invasive ductal carcinoma, grade 1. Prognostic indicators significant for: estrogen receptor, 100% positive and progesterone receptor, 70% positive, both with strong staining intensity. Proliferation marker Ki67 at 5%. HER2 negative by immunohistochemistry (1+).  The patient's subsequent history is as detailed below.   PAST MEDICAL HISTORY: Past Medical History:  Diagnosis Date  . Allergy   . Anxiety   . Herpes simplex    from [facility] chart  . Hypercholesterolemia    from [facility] chart  . Hyperglycemia    from [facility] chart  . Hypertension   . Insomnia    from [facility] chart  . Pre-diabetes      PAST SURGICAL HISTORY: Past Surgical History:  Procedure Laterality Date  . ABDOMINAL HYSTERECTOMY    . BREAST LUMPECTOMY WITH RADIOACTIVE SEED LOCALIZATION Left 02/14/2020   Procedure: LEFT BREAST LUMPECTOMY WITH RADIOACTIVE SEED LOCALIZATION;  Surgeon: Manus Rudd, MD;  Location: [facility];  Service: General;  Laterality: Left;  LMA  . CATARACT EXTRACTION     from [facility] chart  . HEEL SPUR SURGERY Bilateral    from [facility] chart  . RE-EXCISION OF BREAST LUMPECTOMY Left 02/29/2020   Procedure: RE-EXCISION OF LEFT BREAST LUMPECTOMY INFERIOR AND MEDIAL MARGINS;  Surgeon: Manus Rudd, MD;  Location: [facility];  Service: General;  Laterality: Left;  LMA  The patient has had Achilles tendon surgery on both heels, is status post urethral support surgery under Dr. Chauncy Lean   FAMILY HISTORY: Family History  Problem Relation Age of Onset  . Colon cancer Mother 21  . Breast cancer Sister 57  . Asthma Brother    Her mother was diagnosed with colon cancer at age 53 and lived  to age 3. Her father died at age 81 from pneumonia.  The patient had 3 brothers and 3 sisters.  1 sister was diagnosed with breast cancer in her 66's and died at age 13.  There is no history of prostate ovarian or pancreatic cancer in the family to the patient's knowledge   GYNECOLOGIC HISTORY:  No  LMP recorded. Patient has had a hysterectomy. Menarche: 87 years old Age at first live birth: 87 years old Lacey P 2 LMP 1973 Contraceptive: never used HRT never used  Hysterectomy? Yes, 1973 BSO?  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 Husband [name] died in 01/12/00; he had also worked for [employer].  Son Elta Guadeloupe, age 52, runs the [employer] in [place], [place].  His daughter and [name] is an Engineering geologist at [employer].  Son Barbaraann Rondo "Randall Hiss," age 71, runs a different trucking company.  Kaliyah has 4 grandchildren and 3 great-grandchildren. She attends a [employer] in [place].    ADVANCED DIRECTIVES: In place; sons Elta Guadeloupe and Randall Hiss together are her HCPOA.   HEALTH MAINTENANCE: Social History   Tobacco Use  . Smoking status: Former Smoker    Quit date: 01/31/1962    Years since quitting: 59.0  . Smokeless tobacco: Never Used  Substance Use Topics  . Alcohol use: Not Currently  . Drug use: Never     Colonoscopy: January 11, 2009 (Dr. Earlean Shawl), repeat not indicated due to age  PAP: 1972-01-12, prior to hysterectomy  Bone density: date unknown.   Allergies  Allergen Reactions  . Aspirin Other (See Comments)    Stomach ulcers unknown   . Indomethacin   . Other     Other reaction(s): GI Bleeding  . Pantoprazole Nausea And Vomiting and Other (See Comments)    Stomach ulcers unknown     Current Outpatient Medications  Medication Sig Dispense Refill  . ALPRAZolam (XANAX) 0.5 MG tablet     . gemfibrozil (LOPID) 600 MG tablet     . hydrochlorothiazide (HYDRODIURIL) 25 MG tablet TAKE 1 TABLET EVERY DAY    . metoprolol tartrate (LOPRESSOR) 25 MG tablet Take 25 mg by mouth 2 (two) times daily.    . mirtazapine (REMERON) 7.5 MG tablet     . ramipril (ALTACE) 10 MG capsule      No current facility-administered medications for this visit.    OBJECTIVE: White woman who appears stated  age  87:   02/06/21 1122  BP: (!) 164/77  Pulse: (!) 54  Resp: 18  Temp: (!) 97.5 F (36.4 C)  SpO2: 98%     Body mass index is 25.94 kg/m.   Wt Readings from Last 3 Encounters:  02/06/21 155 lb 14.4 oz (70.7 kg)  06/04/20 160 lb 1.6 oz (72.6 kg)  03/27/20 158 lb 6.4 oz (71.8 kg)      ECOG FS:1 - Symptomatic but completely ambulatory  Sclerae unicteric, EOMs intact Wearing a mask No cervical or supraclavicular adenopathy Lungs no rales or rhonchi Heart regular rate and rhythm Abd soft, nontender, positive bowel sounds MSK no focal spinal tenderness, no upper extremity lymphedema Neuro: nonfocal, well oriented, appropriate affect Breasts: The right breast is benign per the left breast is status post lumpectomy and radiation.  There is no evidence of local recurrence.  Both axillae are benign.   LAB RESULTS:  CMP     Component Value Date/Time   NA  137 02/06/2021 1044   K 3.7 02/06/2021 1044   CL 101 02/06/2021 1044   CO2 27 02/06/2021 1044   GLUCOSE 135 (H) 02/06/2021 1044   BUN 8 02/06/2021 1044   CREATININE 0.74 02/06/2021 1044   CREATININE 0.83 02/01/2020 1225   CALCIUM 8.9 02/06/2021 1044   PROT 6.2 (L) 02/06/2021 1044   ALBUMIN 3.6 02/06/2021 1044   AST 21 02/06/2021 1044   AST 24 02/01/2020 1225   ALT 14 02/06/2021 1044   ALT 19 02/01/2020 1225   ALKPHOS 37 (L) 02/06/2021 1044   BILITOT 0.8 02/06/2021 1044   BILITOT 0.8 02/01/2020 1225   GFRNONAA >60 02/06/2021 1044   GFRNONAA >60 02/01/2020 1225   GFRAA >60 03/27/2020 1107   GFRAA >60 02/01/2020 1225    No results found for: TOTALPROTELP, ALBUMINELP, A1GS, A2GS, BETS, BETA2SER, GAMS, MSPIKE, SPEI  Lab Results  Component Value Date   WBC 6.0 02/06/2021   NEUTROABS 3.8 02/06/2021   HGB 12.7 02/06/2021   HCT 36.2 02/06/2021   MCV 90.5 02/06/2021   PLT 231 02/06/2021    No results found for: LABCA2  No components found for: GHWEXH371  No results for input(s): INR in the last 168  hours.  No results found for: LABCA2  No results found for: IRC789  No results found for: FYB017  No results found for: PZW258  No results found for: CA2729  No components found for: HGQUANT  No results found for: CEA1 / No results found for: CEA1   No results found for: AFPTUMOR  No results found for: CHROMOGRNA  No results found for: KPAFRELGTCHN, LAMBDASER, KAPLAMBRATIO (kappa/lambda light chains)  No results found for: HGBA, HGBA2QUANT, HGBFQUANT, HGBSQUAN (Hemoglobinopathy evaluation)   No results found for: LDH  No results found for: IRON, TIBC, IRONPCTSAT (Iron and TIBC)  Lab Results  Component Value Date   FERRITIN 52.3 02/06/2009    Urinalysis    Component Value Date/Time   COLORURINE YELLOW 11/14/2008 0801   APPEARANCEUR CLOUDY (A) 11/14/2008 0801   LABSPEC 1.017 11/14/2008 0801   PHURINE 6.0 11/14/2008 0801   GLUCOSEU NEGATIVE 11/14/2008 0801   HGBUR LARGE (A) 11/14/2008 0801   BILIRUBINUR NEGATIVE 11/14/2008 0801   KETONESUR NEGATIVE 11/14/2008 0801   PROTEINUR NEGATIVE 11/14/2008 0801   UROBILINOGEN 1.0 11/14/2008 0801   NITRITE NEGATIVE 11/14/2008 0801   LEUKOCYTESUR MODERATE (A) 11/14/2008 0801    STUDIES: No results found.   ELIGIBLE FOR AVAILABLE RESEARCH PROTOCOL:AET  ASSESSMENT: 87 y.o. [name] status post left breast upper outer quadrant biopsy 01/23/2020 for a clinical T1c N0, stage IA invasive ductal carcinoma, grade 1, estrogen and progesterone receptor positive, HER-2 not amplified, with an MIB-1 of 5%.  (1) s/p left lumpectomy 02/14/2020 without sentinel lymph node sampling for a pT1c cN0, stage IA invasive ductal carcinoma, grade 1, with positive margins  (a) additional surgery 02/29/2020 cleared margins  (2) adjuvant radiation 03/28/2020 - 04/24/2020  (a) left breast / 42.56 Gy in 16 fractions  (b) seroma boost / 10 Gy in 4 fractions  (3) tamoxifen started 06/05/2020  (a) status post hysterectomy   PLAN:  Denise Strickland  is now a year out from definitive surgery for breast cancer with no evidence of disease recurrence.  This is favorable.  She is tolerating tamoxifen well and the plan is to continue this a total of 5 years.  At this point she is stable enough that I feel comfortable with her seeing [place] once a year.  She Denise continue  to have mammography at [facility], with the next one due April of next year and she Denise see [place] shortly after that  I commended her being active and we discussed fall precautions.  Total encounter time 25 minutes.Sarajane Jews C. Christella App, MD 02/06/2021 11:37 AM Medical Oncology and Hematology [facility] [address] [place], [place] [address] Tel. [phone]    Fax. [phone]   This document serves as a record of services personally performed by Lurline Del, MD. It was created on his behalf by Wilburn Mylar, a trained medical scribe. The creation of this record is based on the scribe's personal observations and the provider's statements to them.   I, Lurline Del MD, have reviewed the above documentation for accuracy and completeness, and I agree with the above.   *Total Encounter Time as defined by the Centers for Medicare and Medicaid Services includes, in addition to the face-to-face time of a patient visit (documented in the note above) non-face-to-face time: obtaining and reviewing outside history, ordering and reviewing medications, tests or procedures, care coordination (communications with other health care professionals or caregivers) and documentation in the medical record.

## 2021-02-06 ENCOUNTER — Inpatient Hospital Stay: Payer: Medicare HMO | Attending: Oncology | Admitting: Oncology

## 2021-02-06 ENCOUNTER — Other Ambulatory Visit: Payer: Self-pay

## 2021-02-06 ENCOUNTER — Inpatient Hospital Stay: Payer: Medicare HMO

## 2021-02-06 VITALS — BP 164/77 | HR 54 | Temp 97.5°F | Resp 18 | Ht 65.0 in | Wt 155.9 lb

## 2021-02-06 DIAGNOSIS — Z800 Family history of malignant neoplasm of digestive organs: Secondary | ICD-10-CM | POA: Diagnosis not present

## 2021-02-06 DIAGNOSIS — C50412 Malignant neoplasm of upper-outer quadrant of left female breast: Secondary | ICD-10-CM | POA: Diagnosis present

## 2021-02-06 DIAGNOSIS — Z803 Family history of malignant neoplasm of breast: Secondary | ICD-10-CM | POA: Insufficient documentation

## 2021-02-06 DIAGNOSIS — Z923 Personal history of irradiation: Secondary | ICD-10-CM | POA: Diagnosis not present

## 2021-02-06 DIAGNOSIS — Z170 Estrogen receptor positive status [ER+]: Secondary | ICD-10-CM

## 2021-02-06 DIAGNOSIS — Z79810 Long term (current) use of selective estrogen receptor modulators (SERMs): Secondary | ICD-10-CM | POA: Insufficient documentation

## 2021-02-06 DIAGNOSIS — Z90710 Acquired absence of both cervix and uterus: Secondary | ICD-10-CM | POA: Insufficient documentation

## 2021-02-06 DIAGNOSIS — Z87891 Personal history of nicotine dependence: Secondary | ICD-10-CM | POA: Diagnosis not present

## 2021-02-06 LAB — CBC WITH DIFFERENTIAL/PLATELET
Abs Immature Granulocytes: 0.01 K/uL (ref 0.00–0.07)
Basophils Absolute: 0.1 K/uL (ref 0.0–0.1)
Basophils Relative: 1 %
Eosinophils Absolute: 0.3 K/uL (ref 0.0–0.5)
Eosinophils Relative: 4 %
HCT: 36.2 % (ref 36.0–46.0)
Hemoglobin: 12.7 g/dL (ref 12.0–15.0)
Immature Granulocytes: 0 %
Lymphocytes Relative: 24 %
Lymphs Abs: 1.5 K/uL (ref 0.7–4.0)
MCH: 31.8 pg (ref 26.0–34.0)
MCHC: 35.1 g/dL (ref 30.0–36.0)
MCV: 90.5 fL (ref 80.0–100.0)
Monocytes Absolute: 0.5 K/uL (ref 0.1–1.0)
Monocytes Relative: 8 %
Neutro Abs: 3.8 K/uL (ref 1.7–7.7)
Neutrophils Relative %: 63 %
Platelets: 231 K/uL (ref 150–400)
RBC: 4 MIL/uL (ref 3.87–5.11)
RDW: 11.9 % (ref 11.5–15.5)
WBC: 6 K/uL (ref 4.0–10.5)
nRBC: 0 % (ref 0.0–0.2)

## 2021-02-06 LAB — COMPREHENSIVE METABOLIC PANEL WITH GFR
ALT: 14 U/L (ref 0–44)
AST: 21 U/L (ref 15–41)
Albumin: 3.6 g/dL (ref 3.5–5.0)
Alkaline Phosphatase: 37 U/L — ABNORMAL LOW (ref 38–126)
Anion gap: 9 (ref 5–15)
BUN: 8 mg/dL (ref 8–23)
CO2: 27 mmol/L (ref 22–32)
Calcium: 8.9 mg/dL (ref 8.9–10.3)
Chloride: 101 mmol/L (ref 98–111)
Creatinine, Ser: 0.74 mg/dL (ref 0.44–1.00)
GFR, Estimated: >60 mL/min
Glucose, Bld: 135 mg/dL — ABNORMAL HIGH (ref 70–99)
Potassium: 3.7 mmol/L (ref 3.5–5.1)
Sodium: 137 mmol/L (ref 135–145)
Total Bilirubin: 0.8 mg/dL (ref 0.3–1.2)
Total Protein: 6.2 g/dL — ABNORMAL LOW (ref 6.5–8.1)

## 2021-02-06 MED ORDER — TAMOXIFEN CITRATE 20 MG PO TABS: 20.0000 mg | ORAL_TABLET | Freq: Every day | ORAL | 12 refills | Status: AC

## 2021-04-23 ENCOUNTER — Encounter (HOSPITAL_COMMUNITY): Payer: Self-pay

## 2021-09-09 ENCOUNTER — Ambulatory Visit: Payer: Medicare HMO | Admitting: Adult Health

## 2021-09-09 ENCOUNTER — Other Ambulatory Visit: Payer: Self-pay

## 2021-09-09 LAB — HM DIABETES EYE EXAM

## 2021-10-02 ENCOUNTER — Ambulatory Visit: Payer: Medicare HMO | Admitting: Family Medicine

## 2022-02-03 ENCOUNTER — Encounter: Payer: Self-pay | Admitting: Adult Health

## 2022-05-14 ENCOUNTER — Inpatient Hospital Stay: Payer: Medicare HMO | Attending: Adult Health | Admitting: Adult Health

## 2022-05-14 ENCOUNTER — Other Ambulatory Visit: Payer: Self-pay

## 2022-05-14 ENCOUNTER — Encounter: Payer: Self-pay | Admitting: Adult Health

## 2022-05-14 VITALS — BP 155/73 | HR 53 | Temp 98.2°F | Resp 18 | Ht 65.0 in | Wt 151.6 lb

## 2022-05-14 DIAGNOSIS — Z79810 Long term (current) use of selective estrogen receptor modulators (SERMs): Secondary | ICD-10-CM | POA: Diagnosis not present

## 2022-05-14 DIAGNOSIS — Z90710 Acquired absence of both cervix and uterus: Secondary | ICD-10-CM | POA: Diagnosis not present

## 2022-05-14 DIAGNOSIS — Z800 Family history of malignant neoplasm of digestive organs: Secondary | ICD-10-CM | POA: Insufficient documentation

## 2022-05-14 DIAGNOSIS — C50412 Malignant neoplasm of upper-outer quadrant of left female breast: Secondary | ICD-10-CM | POA: Insufficient documentation

## 2022-05-14 DIAGNOSIS — Z803 Family history of malignant neoplasm of breast: Secondary | ICD-10-CM | POA: Diagnosis not present

## 2022-05-14 DIAGNOSIS — Z87891 Personal history of nicotine dependence: Secondary | ICD-10-CM | POA: Insufficient documentation

## 2022-05-14 DIAGNOSIS — Z923 Personal history of irradiation: Secondary | ICD-10-CM | POA: Diagnosis not present

## 2022-05-14 DIAGNOSIS — Z170 Estrogen receptor positive status [ER+]: Secondary | ICD-10-CM | POA: Diagnosis not present

## 2022-05-14 MED ORDER — ANASTROZOLE 1 MG PO TABS: 1.0000 mg | ORAL_TABLET | Freq: Every day | ORAL | 3 refills | Status: DC

## 2022-05-14 NOTE — Progress Notes (Signed)
[facility] Follow up:    [name], PA-C [address] [place] [address]   DIAGNOSIS:  Cancer Staging  Malignant neoplasm of upper-outer quadrant of left breast in female, estrogen receptor positive ([place]) Staging form: Breast, AJCC 8th Edition - Clinical stage from 02/01/2020: Stage IA (cT1a, cN0, cM0, G1, ER+, PR+, HER2-) - Unsigned Stage prefix: Initial diagnosis Histologic grading system: 3 grade system - Pathologic stage from 03/20/2020: Stage IA (pT1c, pN0, cM0, G1, ER+, PR+, HER2-) - Unsigned Stage prefix: Initial diagnosis Method of lymph node assessment: Clinical Multigene prognostic tests performed: None Histologic grading system: 3 grade system   SUMMARY OF ONCOLOGIC HISTORY: [name] status post left breast upper outer quadrant biopsy 01/23/2020 for a clinical T1c N0, stage IA invasive ductal carcinoma, grade 1, estrogen and progesterone receptor positive, HER-2 not amplified, with an MIB-1 of 5%.   (1) s/p left lumpectomy 02/14/2020 without sentinel lymph node sampling for a pT1c cN0, stage IA invasive ductal carcinoma, grade 1, with positive margins             (a) additional surgery 02/29/2020 cleared margins   (2) adjuvant radiation 03/28/2020 - 04/24/2020             (a) left breast / 42.56 Gy in 16 fractions             (b) seroma boost / 10 Gy in 4 fractions   (3) tamoxifen started 06/05/2020             (a) status post hysterectomy  CURRENT THERAPY: Tamoxifen  INTERVAL HISTORY: Devonne Doughty 88 y.o. female returns for follow-up of her estrogen positive breast cancer.  Her most recent imaging was completed with bilateral diagnostic mammogram on February 03, 2022 showing a left breast seroma otherwise no malignancy and breast density category B. She had difficulty with Tamoxifen and stopped taking it.  She has not yet restarted any antiestrogen therapy.  She is otherwise doing well today.     Patient Active Problem List    Diagnosis Date Noted   Malignant neoplasm of upper-outer quadrant of left breast in female, estrogen receptor positive ([place]) 01/26/2020   Insomnia 06/21/2019   IFG (impaired fasting glucose) 06/21/2019   Hypertriglyceridemia 12/19/2015   Hypertension 03/17/2012   ANEMIA, SECONDARY TO ACUTE BLOOD LOSS 12/12/2008   Acute duodenal ulcer with hemorrhage 12/12/2008    is allergic to aspirin, indomethacin, other, and pantoprazole.  MEDICAL HISTORY: Past Medical History:  Diagnosis Date   Allergy    Anxiety    Herpes simplex    from [facility] chart   Hypercholesterolemia    from [facility] chart   Hyperglycemia    from [facility] chart   Hypertension    Insomnia    from [facility] chart   Pre-diabetes     SURGICAL HISTORY: Past Surgical History:  Procedure Laterality Date   ABDOMINAL HYSTERECTOMY     BREAST LUMPECTOMY WITH RADIOACTIVE SEED LOCALIZATION Left 02/14/2020   Procedure: LEFT BREAST LUMPECTOMY WITH RADIOACTIVE SEED LOCALIZATION;  Surgeon: Donnie Mesa, MD;  Location: [place];  Service: General;  Laterality: Left;  LMA   CATARACT EXTRACTION     from [facility] chart   [place] Bilateral    from [facility] chart   RE-EXCISION OF BREAST LUMPECTOMY Left 02/29/2020   Procedure: RE-EXCISION OF LEFT BREAST LUMPECTOMY INFERIOR AND MEDIAL MARGINS;  Surgeon: Donnie Mesa, MD;  Location: [place];  Service: General;  Laterality:  Left;  LMA    SOCIAL HISTORY: Social History   Socioeconomic History   Marital status: Widowed    Spouse name: Not on file   Number of children: Not on file   Years of education: Not on file   Highest education level: Not on file  Occupational History   Not on file  Tobacco Use   Smoking status: Former    Types: Cigarettes    Quit date: 01/31/1962    Years since quitting: 60.3   Smokeless tobacco: Never  Substance and Sexual Activity   Alcohol use: Not Currently   Drug use: Never   Sexual activity: Not on file  Other  Topics Concern   Not on file  Social History Narrative   Not on file   Social Determinants of Health   Financial Resource Strain: Not on file  Food Insecurity: Not on file  Transportation Needs: Not on file  Physical Activity: Not on file  Stress: Not on file  Social Connections: Not on file  Intimate Partner Violence: Not on file    FAMILY HISTORY: Family History  Problem Relation Age of Onset   Colon cancer Mother 27   Breast cancer Sister 105   Asthma Brother     Review of Systems  Constitutional:  Negative for appetite change, chills, fatigue, fever and unexpected weight change.  HENT:   Negative for hearing loss, lump/mass and trouble swallowing.   Eyes:  Negative for eye problems and icterus.  Respiratory:  Negative for chest tightness, cough and shortness of breath.   Cardiovascular:  Negative for chest pain, leg swelling and palpitations.  Gastrointestinal:  Negative for abdominal distention, abdominal pain, constipation, diarrhea, nausea and vomiting.  Endocrine: Negative for hot flashes.  Genitourinary:  Negative for difficulty urinating.   Musculoskeletal:  Negative for arthralgias.  Skin:  Negative for itching and rash.  Neurological:  Negative for dizziness, extremity weakness, headaches and numbness.  Hematological:  Negative for adenopathy. Does not bruise/bleed easily.  Psychiatric/Behavioral:  Negative for depression. The patient is not nervous/anxious.       PHYSICAL EXAMINATION  ECOG PERFORMANCE STATUS: 1 - Symptomatic but completely ambulatory  Vitals:   05/14/22 1039  BP: (!) 155/73  Pulse: (!) 53  Resp: 18  Temp: 98.2 F (36.8 C)  SpO2: 99%    Physical Exam Constitutional:      General: She is not in acute distress.    Appearance: Normal appearance. She is not toxic-appearing.  HENT:     Head: Normocephalic and atraumatic.  Eyes:     General: No scleral icterus. Cardiovascular:     Rate and Rhythm: Normal rate and regular rhythm.      Pulses: Normal pulses.     Heart sounds: Normal heart sounds.  Pulmonary:     Effort: Pulmonary effort is normal.     Breath sounds: Normal breath sounds.  Chest:     Comments: Left breast s/p lumpectomy and radiation, seroma noted, no sign of recurrence, right breast is benign Abdominal:     General: Abdomen is flat. Bowel sounds are normal. There is no distension.     Palpations: Abdomen is soft.     Tenderness: There is no abdominal tenderness.  Musculoskeletal:        General: No swelling.     Cervical back: Neck supple.  Lymphadenopathy:     Cervical: No cervical adenopathy.  Skin:    General: Skin is warm and dry.     Findings: No rash.  Neurological:     General: No focal deficit present.     Mental Status: She is alert.  Psychiatric:        Mood and Affect: Mood normal.        Behavior: Behavior normal.     LABORATORY DATA:  CBC    Component Value Date/Time   WBC 6.0 02/06/2021 1044   RBC 4.00 02/06/2021 1044   HGB 12.7 02/06/2021 1044   HGB 14.0 02/01/2020 1225   HCT 36.2 02/06/2021 1044   PLT 231 02/06/2021 1044   PLT 295 02/01/2020 1225   MCV 90.5 02/06/2021 1044   MCH 31.8 02/06/2021 1044   MCHC 35.1 02/06/2021 1044   RDW 11.9 02/06/2021 1044   LYMPHSABS 1.5 02/06/2021 1044   MONOABS 0.5 02/06/2021 1044   EOSABS 0.3 02/06/2021 1044   BASOSABS 0.1 02/06/2021 1044    CMP     Component Value Date/Time   NA 137 02/06/2021 1044   K 3.7 02/06/2021 1044   CL 101 02/06/2021 1044   CO2 27 02/06/2021 1044   GLUCOSE 135 (H) 02/06/2021 1044   BUN 8 02/06/2021 1044   CREATININE 0.74 02/06/2021 1044   CREATININE 0.83 02/01/2020 1225   CALCIUM 8.9 02/06/2021 1044   PROT 6.2 (L) 02/06/2021 1044   ALBUMIN 3.6 02/06/2021 1044   AST 21 02/06/2021 1044   AST 24 02/01/2020 1225   ALT 14 02/06/2021 1044   ALT 19 02/01/2020 1225   ALKPHOS 37 (L) 02/06/2021 1044   BILITOT 0.8 02/06/2021 1044   BILITOT 0.8 02/01/2020 1225   GFRNONAA >60 02/06/2021 1044    GFRNONAA >60 02/01/2020 1225   GFRAA >60 03/27/2020 1107   GFRAA >60 02/01/2020 1225       ASSESSMENT and THERAPY PLAN:   Malignant neoplasm of upper-outer quadrant of left breast in female, estrogen receptor positive ([place]) Demika is an 88 year old woman with h/o estrogen positive breast cancer.  She had not been taking Tamoxifen and we sent in Anastrozole for her to try instead.  She is going to let us know if she tolerates this medication better.    We will work on obtaining her DEXA reports from her PCP office in the meantime and we counseled her on healthy diet, calcium intake, and weight bearing exercise.    We will see Linnell back in 6 months for f/u.      All questions were answered. The patient knows to call the clinic with any problems, questions or concerns. We can certainly see the patient much sooner if necessary.  Total encounter time:20 minutes*in face-to-face visit time, chart review, lab review, care coordination, order entry, and documentation of the encounter time.    Wilber Bihari, NP 05/19/22 1:57 PM Medical Oncology and Hematology [facility] [place], [place] [address] Tel. [phone]    Fax. [phone]  *Total Encounter Time as defined by the Centers for Medicare and Medicaid Services includes, in addition to the face-to-face time of a patient visit (documented in the note above) non-face-to-face time: obtaining and reviewing outside history, ordering and reviewing medications, tests or procedures, care coordination (communications with other health care professionals or caregivers) and documentation in the medical record.

## 2022-05-19 NOTE — Assessment & Plan Note (Signed)
Denise Strickland is an 88 year old woman with h/o estrogen positive breast cancer.  She had not been taking Tamoxifen and we sent in Anastrozole for her to try instead.  She is going to let us know if she tolerates this medication better.    We will work on obtaining her DEXA reports from her PCP office in the meantime and we counseled her on healthy diet, calcium intake, and weight bearing exercise.    We will see Shruti back in 6 months for f/u.

## 2022-06-02 ENCOUNTER — Ambulatory Visit: Payer: Medicare HMO | Admitting: Hematology and Oncology

## 2022-11-14 ENCOUNTER — Inpatient Hospital Stay: Payer: Medicare HMO | Admitting: Adult Health

## 2022-11-24 ENCOUNTER — Inpatient Hospital Stay: Payer: Medicare HMO | Attending: Adult Health | Admitting: Adult Health

## 2022-11-24 ENCOUNTER — Other Ambulatory Visit: Payer: Self-pay

## 2022-11-24 ENCOUNTER — Encounter: Payer: Self-pay | Admitting: Adult Health

## 2022-11-24 VITALS — BP 176/71 | HR 57 | Temp 97.7°F | Resp 16 | Ht 65.0 in | Wt 152.2 lb

## 2022-11-24 DIAGNOSIS — C50412 Malignant neoplasm of upper-outer quadrant of left female breast: Secondary | ICD-10-CM | POA: Diagnosis present

## 2022-11-24 DIAGNOSIS — Z87891 Personal history of nicotine dependence: Secondary | ICD-10-CM | POA: Diagnosis not present

## 2022-11-24 DIAGNOSIS — Z79810 Long term (current) use of selective estrogen receptor modulators (SERMs): Secondary | ICD-10-CM | POA: Insufficient documentation

## 2022-11-24 DIAGNOSIS — Z90710 Acquired absence of both cervix and uterus: Secondary | ICD-10-CM | POA: Diagnosis not present

## 2022-11-24 DIAGNOSIS — Z170 Estrogen receptor positive status [ER+]: Secondary | ICD-10-CM | POA: Diagnosis not present

## 2022-11-24 NOTE — Progress Notes (Signed)
[place] [facility] Follow up:    Denise Halim., Denise Strickland [address] [place] [address]   DIAGNOSIS:  Cancer Staging  Malignant neoplasm of upper-outer quadrant of left breast in female, estrogen receptor positive ([place]) Staging form: Breast, AJCC 8th Edition - Clinical stage from 02/01/2020: Stage IA (cT1a, cN0, cM0, G1, ER+, PR+, HER2-) - Unsigned Stage prefix: Initial diagnosis Histologic grading system: 3 grade system - Pathologic stage from 03/20/2020: Stage IA (pT1c, pN0, cM0, G1, ER+, PR+, HER2-) - Unsigned Stage prefix: Initial diagnosis Method of lymph node assessment: Clinical Multigene prognostic tests performed: None Histologic grading system: 3 grade system   SUMMARY OF ONCOLOGIC HISTORY: [name] status post left breast upper outer quadrant biopsy 01/23/2020 for a clinical T1c N0, stage IA invasive ductal carcinoma, grade 1, estrogen and progesterone receptor positive, HER-2 not amplified, with an MIB-1 of 5%.   (1) s/p left lumpectomy 02/14/2020 without sentinel lymph node sampling for a pT1c cN0, stage IA invasive ductal carcinoma, grade 1, with positive margins             (a) additional surgery 02/29/2020 cleared margins   (2) adjuvant radiation 03/28/2020 - 04/24/2020             (a) left breast / 42.56 Gy in 16 fractions             (b) seroma boost / 10 Gy in 4 fractions   (3) tamoxifen started 06/05/2020             (a) status post hysterectomy  CURRENT THERAPY: Tamoxifen  INTERVAL HISTORY: Denise Strickland 89 y.o. female returns for follow-up of her history of left breast estrogen positive breast cancer.  She continues on tamoxifen daily with good tolerance.  She denies any hot flashes, vaginal discharge, joint aches and pains, swelling in either leg, or vaginal bleeding.   Her most recent mammogram occurred on February 03, 2022 demonstrating a seroma with the recommendation of an ultrasound.  The ultrasound demonstrated a benign 5.2  cm palpable oval fluid collection in the left upper breast correlating with a seroma.  There was no evidence of malignancy and a routine mammogram was recommended to be completed in April 2024.  And tells me she is doing well.  She continues to stay active inside her home, she does not walk outside very often.  She still sees her general practitioner regularly.  She notes that her left breast does have some achiness at the site of the seroma.  She wants to know if there is anything to do about the seroma.   Patient Active Problem List   Diagnosis Date Noted   Malignant neoplasm of upper-outer quadrant of left breast in female, estrogen receptor positive ([place]) 01/26/2020   Insomnia 06/21/2019   IFG (impaired fasting glucose) 06/21/2019   Hypertriglyceridemia 12/19/2015   Hypertension 03/17/2012   ANEMIA, SECONDARY TO ACUTE BLOOD LOSS 12/12/2008   Acute duodenal ulcer with hemorrhage 12/12/2008    is allergic to aspirin, indomethacin, other, and pantoprazole.  MEDICAL HISTORY: Past Medical History:  Diagnosis Date   Allergy    Anxiety    Herpes simplex    from [facility] chart   Hypercholesterolemia    from [facility] chart   Hyperglycemia    from [facility] chart   Hypertension    Insomnia    from [facility] chart   Pre-diabetes     SURGICAL HISTORY: Past Surgical History:  Procedure Laterality Date   ABDOMINAL HYSTERECTOMY  BREAST LUMPECTOMY WITH RADIOACTIVE SEED LOCALIZATION Left 02/14/2020   Procedure: LEFT BREAST LUMPECTOMY WITH RADIOACTIVE SEED LOCALIZATION;  Surgeon: Donnie Mesa, MD;  Location: [place];  Service: General;  Laterality: Left;  LMA   CATARACT EXTRACTION     from [facility] chart   [place] Bilateral    from [facility] chart   RE-EXCISION OF BREAST LUMPECTOMY Left 02/29/2020   Procedure: RE-EXCISION OF LEFT BREAST LUMPECTOMY INFERIOR AND MEDIAL MARGINS;  Surgeon: Donnie Mesa, MD;  Location: [place];  Service: General;  Laterality:  Left;  LMA    SOCIAL HISTORY: Social History   Socioeconomic History   Marital status: Widowed    Spouse name: Not on file   Number of children: Not on file   Years of education: Not on file   Highest education level: Not on file  Occupational History   Not on file  Tobacco Use   Smoking status: Former    Types: Cigarettes    Quit date: 01/31/1962    Years since quitting: 60.8   Smokeless tobacco: Never  Substance and Sexual Activity   Alcohol use: Not Currently   Drug use: Never   Sexual activity: Not on file  Other Topics Concern   Not on file  Social History Narrative   Not on file   Social Determinants of Health   Financial Resource Strain: Not on file  Food Insecurity: Not on file  Transportation Needs: Not on file  Physical Activity: Not on file  Stress: Not on file  Social Connections: Not on file  Intimate Partner Violence: Not on file    FAMILY HISTORY: Family History  Problem Relation Age of Onset   Colon cancer Mother 61   Breast cancer Sister 71   Asthma Brother     Review of Systems  Constitutional:  Negative for appetite change, chills, fatigue, fever and unexpected weight change.  HENT:   Negative for hearing loss, lump/mass and trouble swallowing.   Eyes:  Negative for eye problems and icterus.  Respiratory:  Negative for chest tightness, cough and shortness of breath.   Cardiovascular:  Negative for chest pain, leg swelling and palpitations.  Gastrointestinal:  Negative for abdominal distention, abdominal pain, constipation, diarrhea, nausea and vomiting.  Endocrine: Negative for hot flashes.  Genitourinary:  Negative for difficulty urinating.   Musculoskeletal:  Negative for arthralgias.  Skin:  Negative for itching and rash.  Neurological:  Negative for dizziness, extremity weakness, headaches and numbness.  Hematological:  Negative for adenopathy. Does not bruise/bleed easily.  Psychiatric/Behavioral:  Negative for depression. The patient  is not nervous/anxious.       PHYSICAL EXAMINATION  ECOG PERFORMANCE STATUS: 1 - Symptomatic but completely ambulatory  Vitals:   11/24/22 1128  BP: (!) 176/71  Pulse: (!) 57  Resp: 16  Temp: 97.7 F (36.5 C)  SpO2: 96%    Physical Exam Constitutional:      General: She is not in acute distress.    Appearance: Normal appearance. She is not toxic-appearing.  HENT:     Head: Normocephalic and atraumatic.  Eyes:     General: No scleral icterus. Cardiovascular:     Rate and Rhythm: Normal rate and regular rhythm.     Pulses: Normal pulses.     Heart sounds: Normal heart sounds.  Pulmonary:     Effort: Pulmonary effort is normal.     Breath sounds: Normal breath sounds.  Chest:     Comments: Right breast is  benign, left breast there is approximately a 5 cm seroma noted in the upper breast.  There is a symmetry difference between the left breast and the right breast.  There are no signs of breast cancer recurrence present. Abdominal:     General: Abdomen is flat. Bowel sounds are normal. There is no distension.     Palpations: Abdomen is soft.     Tenderness: There is no abdominal tenderness.  Musculoskeletal:        General: No swelling.     Cervical back: Neck supple.  Lymphadenopathy:     Cervical: No cervical adenopathy.  Skin:    General: Skin is warm and dry.     Findings: No rash.  Neurological:     General: No focal deficit present.     Mental Status: She is alert.  Psychiatric:        Mood and Affect: Mood normal.        Behavior: Behavior normal.     LABORATORY DATA: None for this visit   ASSESSMENT and THERAPY PLAN:   Malignant neoplasm of upper-outer quadrant of left breast in female, estrogen receptor positive ([place]) Denise Strickland is an 89 year old woman with history of stage Ia ER/PR positive breast cancer diagnosed in April 2021 status post left lumpectomy followed by adjuvant radiation, and tamoxifen which began in August 2021.  And has no clinical or  radiographic signs of breast cancer recurrence.  She continues on tamoxifen with good tolerance and she will continue this.  Her next mammogram is due in April 2024 at [place].  I recommended healthy diet and exercise to her which she is currently doing within the 4 walls of her home.  She will continue to see her primary care provider regularly.  We discussed her seroma and it feels approximately 5 cm in size which is similar to what was measured back in April 2023.  Since this area is bothering her I will reach out to my colleague Dr. Georgette Dover to see if he can get her in for evaluation and discuss management options for the seroma if they are indicated.  We will see and back in about 6 months for continued follow-up.  She knows to call in the interim if she develops any questions or concerns.   All questions were answered. The patient knows to call the clinic with any problems, questions or concerns. We can certainly see the patient much sooner if necessary.  Total encounter time:30 minutes*in face-to-face visit time, chart review, lab review, care coordination, order entry, and documentation of the encounter time.    Denise Bihari, NP 11/24/22 1:36 PM Medical Oncology and Hematology [facility] [place], [place] [address] Tel. [phone]    Fax. [phone]  *Total Encounter Time as defined by the Centers for Medicare and Medicaid Services includes, in addition to the face-to-face time of a patient visit (documented in the note above) non-face-to-face time: obtaining and reviewing outside history, ordering and reviewing medications, tests or procedures, care coordination (communications with other health care professionals or caregivers) and documentation in the medical record.

## 2022-11-24 NOTE — Assessment & Plan Note (Signed)
Denise Strickland is an 89 year old woman with history of stage Ia ER/PR positive breast cancer diagnosed in April 2021 status post left lumpectomy followed by adjuvant radiation, and tamoxifen which began in August 2021.  And has no clinical or radiographic signs of breast cancer recurrence.  She continues on tamoxifen with good tolerance and she will continue this.  Her next mammogram is due in April 2024 at [place].  I recommended healthy diet and exercise to her which she is currently doing within the 4 walls of her home.  She will continue to see her primary care provider regularly.  We discussed her seroma and it feels approximately 5 cm in size which is similar to what was measured back in April 2023.  Since this area is bothering her I will reach out to my colleague Dr. Georgette Dover to see if he can get her in for evaluation and discuss management options for the seroma if they are indicated.  We will see and back in about 6 months for continued follow-up.  She knows to call in the interim if she develops any questions or concerns.

## 2023-01-14 ENCOUNTER — Telehealth: Payer: Self-pay

## 2023-01-14 NOTE — Telephone Encounter (Signed)
Returned Pt's call regarding breast pain. Pt reports experiencing left breast streaking pain and "heavy feeling" as of yesterday. Pt concerned as left breast was previous site of cancer. Pt agreeable to [facility] appt tomorrow.

## 2023-01-15 ENCOUNTER — Other Ambulatory Visit: Payer: Self-pay

## 2023-01-15 ENCOUNTER — Inpatient Hospital Stay: Payer: Medicare HMO | Attending: Adult Health | Admitting: Adult Health

## 2023-01-15 ENCOUNTER — Encounter: Payer: Self-pay | Admitting: Adult Health

## 2023-01-15 VITALS — BP 135/63 | HR 58 | Temp 97.7°F | Resp 17 | Ht 65.0 in | Wt 150.0 lb

## 2023-01-15 DIAGNOSIS — C50412 Malignant neoplasm of upper-outer quadrant of left female breast: Secondary | ICD-10-CM | POA: Insufficient documentation

## 2023-01-15 DIAGNOSIS — N6489 Other specified disorders of breast: Secondary | ICD-10-CM | POA: Diagnosis not present

## 2023-01-15 DIAGNOSIS — Z170 Estrogen receptor positive status [ER+]: Secondary | ICD-10-CM | POA: Insufficient documentation

## 2023-01-15 DIAGNOSIS — Z90710 Acquired absence of both cervix and uterus: Secondary | ICD-10-CM | POA: Insufficient documentation

## 2023-01-15 DIAGNOSIS — Z79810 Long term (current) use of selective estrogen receptor modulators (SERMs): Secondary | ICD-10-CM | POA: Insufficient documentation

## 2023-01-15 DIAGNOSIS — Z87891 Personal history of nicotine dependence: Secondary | ICD-10-CM | POA: Diagnosis not present

## 2023-01-15 NOTE — Progress Notes (Signed)
Faxed diag MM and [place] orders to [place] at [phone] with receipt confirmation. Called [facility] who confirmed Pt already has existing appt for screening MM on 4/11. [facility] changed order to diagnostic and added [place]. [facility] stated they will call Pt to see if a sooner appt can be scheduled.

## 2023-01-15 NOTE — Assessment & Plan Note (Addendum)
And is a 89 year old woman with stage Ia ER/PR positive left-sided breast cancer diagnosed in April 2021.  She is status postlumpectomy, adjuvant radiation, and antiestrogen with tamoxifen then changed to anastrozole which began in August 2021.  She continues on Anastrozole without any issues.  And is here for left breast fullness.  Is no sign of infection.  Repeat mammogram and ultrasound in April when due.  I placed these orders accordingly.  She will call me if she notices any signs of infection.  She will return in 04/2023 for f/u with Dr. Lindi Adie.

## 2023-01-15 NOTE — Progress Notes (Signed)
[place] [facility] Follow up:    Denise Strickland., PA-C [address] [place] [address]   DIAGNOSIS:  Cancer Staging  Malignant neoplasm of upper-outer quadrant of left breast in female, estrogen receptor positive ([place]) Staging form: Breast, AJCC 8th Edition - Clinical stage from 02/01/2020: Stage IA (cT1a, cN0, cM0, G1, ER+, PR+, HER2-) - Unsigned Stage prefix: Initial diagnosis Histologic grading system: 3 grade system - Pathologic stage from 03/20/2020: Stage IA (pT1c, pN0, cM0, G1, ER+, PR+, HER2-) - Unsigned Stage prefix: Initial diagnosis Method of lymph node assessment: Clinical Multigene prognostic tests performed: None Histologic grading system: 3 grade system   SUMMARY OF ONCOLOGIC HISTORY: [name] status post left breast upper outer quadrant biopsy 01/23/2020 for a clinical T1c N0, stage IA invasive ductal carcinoma, grade 1, estrogen and progesterone receptor positive, HER-2 not amplified, with an MIB-1 of 5%.   (1) s/p left lumpectomy 02/14/2020 without sentinel lymph node sampling for a pT1c cN0, stage IA invasive ductal carcinoma, grade 1, with positive margins             (a) additional surgery 02/29/2020 cleared margins   (2) adjuvant radiation 03/28/2020 - 04/24/2020             (a) left breast / 42.56 Gy in 16 fractions             (b) seroma boost / 10 Gy in 4 fractions   (3) tamoxifen started 06/05/2020; changed to anastrozole due to difficulty with tamoxifen             (a) status post hysterectomy  CURRENT THERAPY: Anastrozole  INTERVAL HISTORY: Denise Strickland 89 y.o. female returns for follow-up and evaluation of left breast swelling.  She notes that several days ago she woke up and felt as if watermelon was on her breast.  She denies any redness, continued swelling but does have mild tenderness.  Her mammogram is due in approximately 2 to 3 weeks.   Patient Active Problem List   Diagnosis Date Noted   Malignant neoplasm of  upper-outer quadrant of left breast in female, estrogen receptor positive ([place]) 01/26/2020   Insomnia 06/21/2019   IFG (impaired fasting glucose) 06/21/2019   Hypertriglyceridemia 12/19/2015   Hypertension 03/17/2012   ANEMIA, SECONDARY TO ACUTE BLOOD LOSS 12/12/2008   Acute duodenal ulcer with hemorrhage 12/12/2008    is allergic to aspirin, indomethacin, other, and pantoprazole.  MEDICAL HISTORY: Past Medical History:  Diagnosis Date   Allergy    Anxiety    Herpes simplex    from [facility] chart   Hypercholesterolemia    from [facility] chart   Hyperglycemia    from [facility] chart   Hypertension    Insomnia    from [facility] chart   Pre-diabetes     SURGICAL HISTORY: Past Surgical History:  Procedure Laterality Date   ABDOMINAL HYSTERECTOMY     BREAST LUMPECTOMY WITH RADIOACTIVE SEED LOCALIZATION Left 02/14/2020   Procedure: LEFT BREAST LUMPECTOMY WITH RADIOACTIVE SEED LOCALIZATION;  Surgeon: Donnie Mesa, MD;  Location: [place];  Service: General;  Laterality: Left;  LMA   CATARACT EXTRACTION     from [facility] chart   [place] Bilateral    from [facility] chart   RE-EXCISION OF BREAST LUMPECTOMY Left 02/29/2020   Procedure: RE-EXCISION OF LEFT BREAST LUMPECTOMY INFERIOR AND MEDIAL MARGINS;  Surgeon: Donnie Mesa, MD;  Location: [place];  Service: General;  Laterality: Left;  LMA  SOCIAL HISTORY: Social History   Socioeconomic History   Marital status: Widowed    Spouse name: Not on file   Number of children: Not on file   Years of education: Not on file   Highest education level: Not on file  Occupational History   Not on file  Tobacco Use   Smoking status: Former    Types: Cigarettes    Quit date: 01/31/1962    Years since quitting: 60.9   Smokeless tobacco: Never  Substance and Sexual Activity   Alcohol use: Not Currently   Drug use: Never   Sexual activity: Not on file  Other Topics Concern   Not on file  Social History  Narrative   Not on file   Social Determinants of Health   Financial Resource Strain: Not on file  Food Insecurity: Not on file  Transportation Needs: Not on file  Physical Activity: Not on file  Stress: Not on file  Social Connections: Not on file  Intimate Partner Violence: Not on file    FAMILY HISTORY: Family History  Problem Relation Age of Onset   Colon cancer Mother 15   Breast cancer Sister 44   Asthma Brother     Review of Systems  Constitutional:  Negative for appetite change, chills, fatigue, fever and unexpected weight change.  HENT:   Negative for hearing loss, lump/mass and trouble swallowing.   Eyes:  Negative for eye problems and icterus.  Respiratory:  Negative for chest tightness, cough and shortness of breath.   Cardiovascular:  Negative for chest pain, leg swelling and palpitations.  Gastrointestinal:  Negative for abdominal distention, abdominal pain, constipation, diarrhea, nausea and vomiting.  Endocrine: Negative for hot flashes.  Genitourinary:  Negative for difficulty urinating.   Musculoskeletal:  Negative for arthralgias.  Skin:  Negative for itching and rash.  Neurological:  Negative for dizziness, extremity weakness, headaches and numbness.  Hematological:  Negative for adenopathy. Does not bruise/bleed easily.  Psychiatric/Behavioral:  Negative for depression. The patient is not nervous/anxious.       PHYSICAL EXAMINATION  ECOG PERFORMANCE STATUS: 1 - Symptomatic but completely ambulatory  Vitals:   01/15/23 0959  BP: 135/63  Pulse: (!) 58  Resp: 17  Temp: 97.7 F (36.5 C)    Physical Exam Constitutional:      General: She is not in acute distress.    Appearance: Normal appearance. She is not toxic-appearing.  HENT:     Head: Normocephalic and atraumatic.  Eyes:     General: No scleral icterus. Cardiovascular:     Rate and Rhythm: Normal rate and regular rhythm.     Pulses: Normal pulses.     Heart sounds: Normal heart  sounds.  Pulmonary:     Effort: Pulmonary effort is normal.     Breath sounds: Normal breath sounds.  Chest:     Comments: Right breast is benign, left breast status postlumpectomy and radiation there is a seroma present that is approximately similar to prior seroma size when I examined her in January.  No erythema, mild tenderness to palpation throughout, no axillary adenopathy, warmth, or other sign of infection. Abdominal:     General: Abdomen is flat. Bowel sounds are normal. There is no distension.     Palpations: Abdomen is soft.     Tenderness: There is no abdominal tenderness.  Musculoskeletal:        General: No swelling.     Cervical back: Neck supple.  Lymphadenopathy:     Cervical: No  cervical adenopathy.  Skin:    General: Skin is warm and dry.     Findings: No rash.  Neurological:     General: No focal deficit present.     Mental Status: She is alert.  Psychiatric:        Mood and Affect: Mood normal.        Behavior: Behavior normal.     LABORATORY DATA: None for this visit   ASSESSMENT and THERAPY PLAN:   Malignant neoplasm of upper-outer quadrant of left breast in female, estrogen receptor positive [facility]) And is a 89 year old woman with stage Ia ER/PR positive left-sided breast cancer diagnosed in April 2021.  She is status postlumpectomy, adjuvant radiation, and antiestrogen with tamoxifen then changed to anastrozole which began in August 2021.  She continues on Anastrozole without any issues.  And is here for left breast fullness.  Is no sign of infection.  Repeat mammogram and ultrasound in April when due.  I placed these orders accordingly.  She will call me if she notices any signs of infection.  She will return in 04/2023 for f/u with Dr. Lindi Adie.   All questions were answered. The patient knows to call the clinic with any problems, questions or concerns. We can certainly see the patient much sooner if necessary.  Total encounter time:20 minutes*in  face-to-face visit time, chart review, lab review, care coordination, order entry, and documentation of the encounter time.    Wilber Bihari, NP 01/15/23 11:03 AM Medical Oncology and Hematology [facility] [place], [place] [address] Tel. [phone]    Fax. [phone]  *Total Encounter Time as defined by the Centers for Medicare and Medicaid Services includes, in addition to the face-to-face time of a patient visit (documented in the note above) non-face-to-face time: obtaining and reviewing outside history, ordering and reviewing medications, tests or procedures, care coordination (communications with other health care professionals or caregivers) and documentation in the medical record.

## 2023-01-30 ENCOUNTER — Encounter: Payer: Self-pay | Admitting: Adult Health

## 2023-03-16 ENCOUNTER — Other Ambulatory Visit: Payer: Self-pay | Admitting: Adult Health

## 2023-03-16 DIAGNOSIS — Z170 Estrogen receptor positive status [ER+]: Secondary | ICD-10-CM

## 2023-05-15 ENCOUNTER — Inpatient Hospital Stay: Payer: Medicare HMO | Admitting: Adult Health

## 2023-05-24 NOTE — Progress Notes (Signed)
Patient Care Team: Richmond Campbell., PA-C as PCP - General (Family Medicine) Manus Rudd, MD as Consulting Physician (General Surgery) Dorothy Puffer, MD as Consulting Physician (Radiation Oncology) [facility]  DIAGNOSIS: No diagnosis found.  SUMMARY OF ONCOLOGIC HISTORY: Oncology History   No history exists.    CHIEF COMPLIANT: Anastrozole    INTERVAL HISTORY: Denise Strickland is a 89 y.o  female returns for follow-up and evaluation of left breast swelling.  She presents to the clinic for a follow-up.    ALLERGIES:  is allergic to aspirin, indomethacin, other, and pantoprazole.  MEDICATIONS:  Current Outpatient Medications  Medication Sig Dispense Refill   anastrozole (ARIMIDEX) 1 MG tablet TAKE 1 TABLET EVERY DAY 90 tablet 3   ascorbic acid (VITAMIN C) 500 MG tablet Take 1 tablet by mouth daily.     Biotin 5000 MCG CAPS Take 5,000 mcg by mouth daily.     Cholecalciferol 125 MCG (5000 UT) TABS Take 2,000 mg by mouth daily.     clonazePAM (KLONOPIN) 0.5 MG tablet      diphenhydrAMINE (BENADRYL) 25 MG tablet Take 25 mg by mouth at bedtime as needed for sleep.     ferrous sulfate 325 (65 FE) MG tablet Take 1 tablet by mouth daily with breakfast.     gemfibrozil (LOPID) 600 MG tablet      hydrochlorothiazide (HYDRODIURIL) 25 MG tablet TAKE 1 TABLET EVERY DAY     metoprolol tartrate (LOPRESSOR) 50 MG tablet Take 50 mg by mouth 2 (two) times daily.     mirtazapine (REMERON) 7.5 MG tablet  (Patient not taking: Reported on 05/14/2022)     Multiple Vitamins-Minerals (OCUVITE ADULT 50+ PO) Take 1 tablet by mouth daily.     ramipril (ALTACE) 10 MG capsule      triamcinolone cream (KENALOG) 0.1 % APPLY TOPICALLY TO AFFECTED AREA(S) TWICE DAILY AS DIRECTED     vitamin B-12 (CYANOCOBALAMIN) 1000 MCG tablet Take 1 tablet by mouth daily.     Vitamin E 670 MG (1000 UT) CAPS Take 1,000 Units by mouth daily.     zinc gluconate 50 MG tablet Take 1 tablet by mouth daily.     No  current facility-administered medications for this visit.    PHYSICAL EXAMINATION: ECOG PERFORMANCE STATUS: {CHL ONC ECOG PS:[phone]}  There were no vitals filed for this visit. There were no vitals filed for this visit.  BREAST:*** No palpable masses or nodules in either right or left breasts. No palpable axillary supraclavicular or infraclavicular adenopathy no breast tenderness or nipple discharge. (exam performed in the presence of a chaperone)  LABORATORY DATA:  I have reviewed the data as listed    Latest Ref Rng & Units 02/06/2021   10:44 AM 03/27/2020   11:07 AM 02/10/2020   10:30 AM  CMP  Glucose 70 - 99 mg/dL 865  784  696   BUN 8 - 23 mg/dL 8  13  10    Creatinine 0.44 - 1.00 mg/dL 2.95  2.84  1.32   Sodium 135 - 145 mmol/L 137  137  135   Potassium 3.5 - 5.1 mmol/L 3.7  3.7  4.0   Chloride 98 - 111 mmol/L 101  101  99   CO2 22 - 32 mmol/L 27  25  25    Calcium 8.9 - 10.3 mg/dL 8.9  44.0  9.6   Total Protein 6.5 - 8.1 g/dL 6.2  6.8    Total Bilirubin 0.3 - 1.2 mg/dL 0.8  1.0    Alkaline Phos 38 - 126 U/L 37  52    AST 15 - 41 U/L 21  21    ALT 0 - 44 U/L 14  15      Lab Results  Component Value Date   WBC 6.0 02/06/2021   HGB 12.7 02/06/2021   HCT 36.2 02/06/2021   MCV 90.5 02/06/2021   PLT 231 02/06/2021   NEUTROABS 3.8 02/06/2021    ASSESSMENT & PLAN:  No problem-specific Assessment & Plan notes found for this encounter.    No orders of the defined types were placed in this encounter.  The patient has a good understanding of the overall plan. she agrees with it. she will call with any problems that may develop before the next visit here. Total time spent: 30 mins including face to face time and time spent for planning, charting and co-ordination of care   Sherlyn Lick, CMA 05/24/23    I Janan Ridge am acting as a Neurosurgeon for [employer]  ***

## 2023-05-25 ENCOUNTER — Inpatient Hospital Stay: Payer: Medicare HMO | Attending: Hematology and Oncology | Admitting: Hematology and Oncology

## 2023-05-25 ENCOUNTER — Other Ambulatory Visit: Payer: Self-pay

## 2023-05-25 VITALS — BP 176/66 | HR 50 | Temp 97.5°F | Resp 18 | Ht 65.0 in | Wt 143.3 lb

## 2023-05-25 DIAGNOSIS — Z170 Estrogen receptor positive status [ER+]: Secondary | ICD-10-CM | POA: Diagnosis not present

## 2023-05-25 DIAGNOSIS — Z87891 Personal history of nicotine dependence: Secondary | ICD-10-CM | POA: Diagnosis not present

## 2023-05-25 DIAGNOSIS — Z79811 Long term (current) use of aromatase inhibitors: Secondary | ICD-10-CM | POA: Insufficient documentation

## 2023-05-25 DIAGNOSIS — G309 Alzheimer's disease, unspecified: Secondary | ICD-10-CM | POA: Insufficient documentation

## 2023-05-25 DIAGNOSIS — Z79810 Long term (current) use of selective estrogen receptor modulators (SERMs): Secondary | ICD-10-CM | POA: Insufficient documentation

## 2023-05-25 DIAGNOSIS — F0280 Dementia in other diseases classified elsewhere without behavioral disturbance: Secondary | ICD-10-CM | POA: Diagnosis not present

## 2023-05-25 DIAGNOSIS — C50412 Malignant neoplasm of upper-outer quadrant of left female breast: Secondary | ICD-10-CM | POA: Insufficient documentation

## 2023-05-25 NOTE — Assessment & Plan Note (Addendum)
02/14/2020: Left lumpectomy without sentinel lymph node sampling T1c N0 stage Ia grade 1 IDC, margins clear with another surgery 02/29/2020 04/24/2020: Completed adjuvant radiation  Antiestrogen therapy: Tamoxifen started 06/05/2020 changed to anastrozole due to intolerance to tamoxifen Anastrozole toxicities: None  Breast cancer surveillance: Breast exam 05/25/2023: Benign Mammogram 01/28/2023: [facility]: Benign, Breast density category B  She has developed Alzheimer's dementia as well as macular degeneration.   return to clinic in 1 year for follow-up

## 2024-01-18 ENCOUNTER — Other Ambulatory Visit: Payer: Self-pay | Admitting: Adult Health

## 2024-01-18 DIAGNOSIS — C50412 Malignant neoplasm of upper-outer quadrant of left female breast: Secondary | ICD-10-CM

## 2024-02-04 ENCOUNTER — Other Ambulatory Visit: Payer: Self-pay | Admitting: *Deleted

## 2024-02-04 DIAGNOSIS — Z170 Estrogen receptor positive status [ER+]: Secondary | ICD-10-CM

## 2024-02-04 DIAGNOSIS — N644 Mastodynia: Secondary | ICD-10-CM

## 2024-02-05 ENCOUNTER — Encounter: Payer: Self-pay | Admitting: Adult Health

## 2024-02-09 ENCOUNTER — Inpatient Hospital Stay (HOSPITAL_BASED_OUTPATIENT_CLINIC_OR_DEPARTMENT_OTHER)
Admission: EM | Admit: 2024-02-09 | Discharge: 2024-02-11 | DRG: 690 | Disposition: A | Discharge status: Home / Self Care | Attending: Internal Medicine | Admitting: Internal Medicine

## 2024-02-09 ENCOUNTER — Emergency Department (HOSPITAL_BASED_OUTPATIENT_CLINIC_OR_DEPARTMENT_OTHER)

## 2024-02-09 ENCOUNTER — Encounter (HOSPITAL_BASED_OUTPATIENT_CLINIC_OR_DEPARTMENT_OTHER): Payer: Self-pay

## 2024-02-09 ENCOUNTER — Other Ambulatory Visit: Payer: Self-pay

## 2024-02-09 DIAGNOSIS — I10 Essential (primary) hypertension: Secondary | ICD-10-CM | POA: Diagnosis present

## 2024-02-09 DIAGNOSIS — Z853 Personal history of malignant neoplasm of breast: Secondary | ICD-10-CM

## 2024-02-09 DIAGNOSIS — R4182 Altered mental status, unspecified: Secondary | ICD-10-CM

## 2024-02-09 DIAGNOSIS — N3000 Acute cystitis without hematuria: Secondary | ICD-10-CM

## 2024-02-09 DIAGNOSIS — Z886 Allergy status to analgesic agent status: Secondary | ICD-10-CM

## 2024-02-09 DIAGNOSIS — Z7984 Long term (current) use of oral hypoglycemic drugs: Secondary | ICD-10-CM

## 2024-02-09 DIAGNOSIS — Z803 Family history of malignant neoplasm of breast: Secondary | ICD-10-CM

## 2024-02-09 DIAGNOSIS — I160 Hypertensive urgency: Secondary | ICD-10-CM | POA: Insufficient documentation

## 2024-02-09 DIAGNOSIS — F0283 Dementia in other diseases classified elsewhere, unspecified severity, with mood disturbance: Secondary | ICD-10-CM | POA: Diagnosis present

## 2024-02-09 DIAGNOSIS — B961 Klebsiella pneumoniae [K. pneumoniae] as the cause of diseases classified elsewhere: Secondary | ICD-10-CM | POA: Diagnosis present

## 2024-02-09 DIAGNOSIS — G309 Alzheimer's disease, unspecified: Secondary | ICD-10-CM | POA: Diagnosis present

## 2024-02-09 DIAGNOSIS — R001 Bradycardia, unspecified: Secondary | ICD-10-CM | POA: Diagnosis present

## 2024-02-09 DIAGNOSIS — E876 Hypokalemia: Secondary | ICD-10-CM | POA: Diagnosis present

## 2024-02-09 DIAGNOSIS — Z79899 Other long term (current) drug therapy: Secondary | ICD-10-CM

## 2024-02-09 DIAGNOSIS — H3530 Unspecified macular degeneration: Secondary | ICD-10-CM | POA: Diagnosis present

## 2024-02-09 DIAGNOSIS — F419 Anxiety disorder, unspecified: Secondary | ICD-10-CM | POA: Diagnosis present

## 2024-02-09 DIAGNOSIS — N390 Urinary tract infection, site not specified: Principal | ICD-10-CM | POA: Diagnosis present

## 2024-02-09 DIAGNOSIS — R441 Visual hallucinations: Secondary | ICD-10-CM | POA: Insufficient documentation

## 2024-02-09 DIAGNOSIS — Z923 Personal history of irradiation: Secondary | ICD-10-CM

## 2024-02-09 DIAGNOSIS — E7800 Pure hypercholesterolemia, unspecified: Secondary | ICD-10-CM | POA: Diagnosis present

## 2024-02-09 DIAGNOSIS — Z888 Allergy status to other drugs, medicaments and biological substances status: Secondary | ICD-10-CM

## 2024-02-09 DIAGNOSIS — Z8711 Personal history of peptic ulcer disease: Secondary | ICD-10-CM

## 2024-02-09 DIAGNOSIS — G4700 Insomnia, unspecified: Secondary | ICD-10-CM | POA: Diagnosis present

## 2024-02-09 DIAGNOSIS — Z87891 Personal history of nicotine dependence: Secondary | ICD-10-CM

## 2024-02-09 DIAGNOSIS — E781 Pure hyperglyceridemia: Secondary | ICD-10-CM | POA: Diagnosis present

## 2024-02-09 DIAGNOSIS — Z8619 Personal history of other infectious and parasitic diseases: Secondary | ICD-10-CM

## 2024-02-09 DIAGNOSIS — F0282 Dementia in other diseases classified elsewhere, unspecified severity, with psychotic disturbance: Secondary | ICD-10-CM | POA: Diagnosis present

## 2024-02-09 DIAGNOSIS — Z90710 Acquired absence of both cervix and uterus: Secondary | ICD-10-CM

## 2024-02-09 DIAGNOSIS — E119 Type 2 diabetes mellitus without complications: Secondary | ICD-10-CM | POA: Diagnosis present

## 2024-02-09 DIAGNOSIS — Z79811 Long term (current) use of aromatase inhibitors: Secondary | ICD-10-CM

## 2024-02-09 HISTORY — DX: Unspecified macular degeneration: H35.30

## 2024-02-09 LAB — I-STAT VENOUS BLOOD GAS, ED
Acid-Base Excess: 2 mmol/L (ref 0.0–2.0)
Bicarbonate: 27.4 mmol/L (ref 20.0–28.0)
Calcium, Ion: 1.29 mmol/L (ref 1.15–1.40)
HCT: 37 % (ref 36.0–46.0)
Hemoglobin: 12.6 g/dL (ref 12.0–15.0)
O2 Saturation: 46 %
Patient temperature: 97.5
Potassium: 3.6 mmol/L (ref 3.5–5.1)
Sodium: 137 mmol/L (ref 135–145)
TCO2: 29 mmol/L (ref 22–32)
pCO2, Ven: 45.5 mmHg (ref 44–60)
pH, Ven: 7.385 (ref 7.25–7.43)
pO2, Ven: 25 mmHg — CL (ref 32–45)

## 2024-02-09 LAB — CBC WITH DIFFERENTIAL/PLATELET
Abs Immature Granulocytes: 0.02 10*3/uL (ref 0.00–0.07)
Basophils Absolute: 0.1 10*3/uL (ref 0.0–0.1)
Basophils Relative: 1 %
Eosinophils Absolute: 0.3 10*3/uL (ref 0.0–0.5)
Eosinophils Relative: 5 %
HCT: 34.8 % — ABNORMAL LOW (ref 36.0–46.0)
Hemoglobin: 12.4 g/dL (ref 12.0–15.0)
Immature Granulocytes: 0 %
Lymphocytes Relative: 28 %
Lymphs Abs: 1.5 10*3/uL (ref 0.7–4.0)
MCH: 32.5 pg (ref 26.0–34.0)
MCHC: 35.6 g/dL (ref 30.0–36.0)
MCV: 91.3 fL (ref 80.0–100.0)
Monocytes Absolute: 0.5 10*3/uL (ref 0.1–1.0)
Monocytes Relative: 9 %
Neutro Abs: 3.2 10*3/uL (ref 1.7–7.7)
Neutrophils Relative %: 57 %
Platelets: 244 10*3/uL (ref 150–400)
RBC: 3.81 MIL/uL — ABNORMAL LOW (ref 3.87–5.11)
RDW: 11.9 % (ref 11.5–15.5)
WBC: 5.5 10*3/uL (ref 4.0–10.5)
nRBC: 0 % (ref 0.0–0.2)

## 2024-02-09 LAB — COMPREHENSIVE METABOLIC PANEL WITH GFR
ALT: 8 U/L (ref 0–44)
AST: 14 U/L — ABNORMAL LOW (ref 15–41)
Albumin: 4 g/dL (ref 3.5–5.0)
Alkaline Phosphatase: 36 U/L — ABNORMAL LOW (ref 38–126)
Anion gap: 8 (ref 5–15)
BUN: 11 mg/dL (ref 8–23)
CO2: 27 mmol/L (ref 22–32)
Calcium: 9 mg/dL (ref 8.9–10.3)
Chloride: 101 mmol/L (ref 98–111)
Creatinine, Ser: 0.69 mg/dL (ref 0.44–1.00)
GFR, Estimated: >60 mL/min (ref >60)
Glucose, Bld: 114 mg/dL — ABNORMAL HIGH (ref 70–99)
Potassium: 3.6 mmol/L (ref 3.5–5.1)
Sodium: 136 mmol/L (ref 135–145)
Total Bilirubin: 0.8 mg/dL (ref 0.0–1.2)
Total Protein: 6 g/dL — ABNORMAL LOW (ref 6.5–8.1)

## 2024-02-09 LAB — RAPID URINE DRUG SCREEN, HOSP PERFORMED
Amphetamines: NOT DETECTED
Barbiturates: NOT DETECTED
Benzodiazepines: NOT DETECTED
Cocaine: NOT DETECTED
Opiates: NOT DETECTED
Tetrahydrocannabinol: NOT DETECTED

## 2024-02-09 LAB — AMMONIA: Ammonia: 10 umol/L (ref 9–35)

## 2024-02-09 LAB — TSH: TSH: 1.555 u[IU]/mL (ref 0.350–4.500)

## 2024-02-09 LAB — URINALYSIS, W/ REFLEX TO CULTURE (INFECTION SUSPECTED)
Bilirubin Urine: NEGATIVE
Glucose, UA: NEGATIVE mg/dL
Hgb urine dipstick: NEGATIVE
Ketones, ur: NEGATIVE mg/dL
Nitrite: POSITIVE — AB
Protein, ur: NEGATIVE mg/dL
Specific Gravity, Urine: 1.011 (ref 1.005–1.030)
pH: 6.5 (ref 5.0–8.0)

## 2024-02-09 LAB — TROPONIN I (HIGH SENSITIVITY): Troponin I (High Sensitivity): 8 ng/L (ref <18)

## 2024-02-09 LAB — ETHANOL: Alcohol, Ethyl (B): <10 mg/dL (ref <10)

## 2024-02-09 LAB — CBG MONITORING, ED: Glucose-Capillary: 129 mg/dL — ABNORMAL HIGH (ref 70–99)

## 2024-02-09 LAB — T4, FREE: Free T4: 0.91 ng/dL (ref 0.61–1.12)

## 2024-02-09 MED ORDER — SODIUM CHLORIDE 0.9 % IV SOLN
1.0000 g | INTRAVENOUS | Status: DC
  Administered 2024-02-10: 1 g via INTRAVENOUS
  Filled 2024-02-09: qty 10

## 2024-02-09 MED ORDER — CLONAZEPAM 1 MG PO TABS
1.0000 mg | ORAL_TABLET | Freq: Every day | ORAL | Status: DC
  Administered 2024-02-09 – 2024-02-10 (×2): 1 mg via ORAL
  Filled 2024-02-09 (×2): qty 1

## 2024-02-09 MED ORDER — GEMFIBROZIL 600 MG PO TABS
600.0000 mg | ORAL_TABLET | Freq: Every day | ORAL | Status: DC
  Administered 2024-02-10 – 2024-02-11 (×2): 600 mg via ORAL
  Filled 2024-02-09 (×2): qty 1

## 2024-02-09 MED ORDER — METOPROLOL TARTRATE 5 MG/5ML IV SOLN: 10.0000 mg | Freq: Once | INTRAVENOUS | Status: AC

## 2024-02-09 MED ORDER — SODIUM CHLORIDE 0.9 % IV SOLN
1.0000 g | Freq: Once | INTRAVENOUS | Status: AC
  Administered 2024-02-09: 1 g via INTRAVENOUS
  Filled 2024-02-09: qty 10

## 2024-02-09 MED ORDER — METOPROLOL TARTRATE 5 MG/5ML IV SOLN
INTRAVENOUS | Status: AC
  Administered 2024-02-09: 10 mg via INTRAVENOUS
  Filled 2024-02-09: qty 10

## 2024-02-09 MED ORDER — RAMIPRIL 5 MG PO CAPS: 10.0000 mg | ORAL_CAPSULE | Freq: Two times a day (BID) | ORAL | Status: DC

## 2024-02-09 MED ORDER — METOPROLOL TARTRATE 50 MG PO TABS
50.0000 mg | ORAL_TABLET | Freq: Two times a day (BID) | ORAL | Status: DC
  Administered 2024-02-09: 50 mg via ORAL
  Filled 2024-02-09: qty 1

## 2024-02-09 MED ORDER — HYDROCHLOROTHIAZIDE 25 MG PO TABS
25.0000 mg | ORAL_TABLET | Freq: Every day | ORAL | Status: DC
  Administered 2024-02-10 – 2024-02-11 (×2): 25 mg via ORAL
  Filled 2024-02-09 (×2): qty 1

## 2024-02-09 MED ORDER — AMLODIPINE BESYLATE 5 MG PO TABS
5.0000 mg | ORAL_TABLET | Freq: Every day | ORAL | Status: DC
  Administered 2024-02-09 – 2024-02-11 (×3): 5 mg via ORAL
  Filled 2024-02-09 (×3): qty 1

## 2024-02-09 MED ORDER — HYDRALAZINE HCL 20 MG/ML IJ SOLN: 10.0000 mg | Freq: Three times a day (TID) | INTRAMUSCULAR | Status: DC | PRN

## 2024-02-09 MED ORDER — RAMIPRIL 5 MG PO CAPS
10.0000 mg | ORAL_CAPSULE | Freq: Two times a day (BID) | ORAL | Status: DC
  Administered 2024-02-09 – 2024-02-11 (×4): 10 mg via ORAL
  Filled 2024-02-09 (×4): qty 2

## 2024-02-09 NOTE — ED Notes (Signed)
 Pt called out requesting results. Ct completed at 11:30. Radiology called by secretary, scan to be read soon

## 2024-02-09 NOTE — ED Provider Notes (Signed)
  Physical Exam  BP (!) 204/88   Pulse (!) 58   Temp 97.8 F (36.6 C)   Resp 12   Ht 5\' 5"  (1.651 m)   Wt 64 kg   SpO2 95%   BMI 23.46 kg/m   Physical Exam Vitals and nursing note reviewed.  HENT:     Head: Normocephalic and atraumatic.  Eyes:     Pupils: Pupils are equal, round, and reactive to light.  Cardiovascular:     Rate and Rhythm: Normal rate and regular rhythm.  Pulmonary:     Effort: Pulmonary effort is normal.     Breath sounds: Normal breath sounds.  Abdominal:     Palpations: Abdomen is soft.     Tenderness: There is no abdominal tenderness.  Skin:    General: Skin is warm and dry.  Neurological:     Mental Status: She is alert.  Psychiatric:        Mood and Affect: Mood normal.     Procedures  Procedures  ED Course / MDM   Clinical Course as of 02/09/24 1756  Tue Feb 09, 2024  1258 Nitrite(!): POSITIVE [JL]  1258 Leukocytes,Ua(!): LARGE [JL]  1258 WBC, UA: 21-50 [JL]  1258 Bacteria, UA(!): RARE [JL]  1755 No acute findings on CT head.  Discussed concern for altered mental status in the setting of acute UTI with the patient and her son at bedside.  They were initially resistant to plan for admission however given her alteration with visual hallucinations I do think it is reasonable to admit her to observation on IV antibiotics.  They were both in agreement after talking about the risks and benefits of discharge with p.o. antibiotics at home.  Discussed with admitting hospitalist accepts patient for admission [MP]    Clinical Course User Index [JL] Rosealee Concha, MD [MP] Sallyanne Creamer, DO   Medical Decision Making I, Rafael Bun DO, have assumed care of this patient from the previous provider pending CT head, reevaluation and disposition  Amount and/or Complexity of Data Reviewed Labs: ordered. Decision-making details documented in ED Course. Radiology: ordered.  Risk Decision regarding hospitalization.          Sallyanne Creamer,  DO 02/09/24 1756

## 2024-02-09 NOTE — ED Notes (Signed)
 Thomas with cl called for transport

## 2024-02-09 NOTE — ED Provider Notes (Signed)
 [place] EMERGENCY DEPARTMENT AT [facility] Provider Note   CSN: [id number] Arrival date & time: 02/09/24  0957     History  Chief Complaint  Patient presents with   Eye Problem    Denise Strickland is a 89 y.o. female.   Eye Problem    89 year old female with medical history significant for anemia, duodenal ulcer with hemorrhage, breast cancer, hypertension, hypertriglyceridemia, macular degeneration presenting to the emergency department with a chief complaint of hallucinations.  The patient denies any vision loss compared to her baseline poor vision in her right eye.  She states that she intermittently sees spots in her vision chronically.  She states that over the past few days since 02/05/2024 she has been hallucinating and seeing objects and people that are not there.  She has been seeing bushes as well as neighbors that do not exist.  No known infectious symptoms.  No headache.  No focal weakness or numbness.  No chest pain, shortness of breath, genitourinary symptoms, abdominal pain.  No SI or HI.  No recent medication changes.  She is on Klonopin outpatient.  Home Medications Prior to Admission medications   Medication Sig Start Date End Date Taking? Authorizing Provider  clonazePAM (KLONOPIN) 1 MG tablet Take 1 tablet by mouth at bedtime. 01/21/24  Yes [provider]  anastrozole (ARIMIDEX) 1 MG tablet TAKE 1 TABLET EVERY DAY 01/18/24   Causey, Larna Daughters, NP  ascorbic acid (VITAMIN C) 500 MG tablet Take 1 tablet by mouth daily.    [provider]  Biotin 5000 MCG CAPS Take 5,000 mcg by mouth daily.    [provider]  Cholecalciferol 125 MCG (5000 UT) TABS Take 2,000 mg by mouth daily.    [provider]  clonazePAM Scarlette Calico) 0.5 MG tablet  09/05/21   [provider]  diphenhydrAMINE (BENADRYL) 25 MG tablet Take 25 mg by mouth at bedtime as needed for sleep.    [provider]  ferrous sulfate 325 (65 FE)  MG tablet Take 1 tablet by mouth daily with breakfast.    [provider]  gemfibrozil (LOPID) 600 MG tablet  01/02/20   [provider]  hydrochlorothiazide (HYDRODIURIL) 25 MG tablet TAKE 1 TABLET EVERY DAY 01/11/20   [provider]  metFORMIN (GLUCOPHAGE-XR) 500 MG 24 hr tablet Take 500 mg by mouth daily.    [provider]  metoprolol tartrate (LOPRESSOR) 50 MG tablet Take 50 mg by mouth 2 (two) times daily.    [provider]  Multiple Vitamins-Minerals (OCUVITE ADULT 50+ PO) Take 1 tablet by mouth daily.    [provider]  ramipril (ALTACE) 10 MG capsule  10/11/19   [provider]  triamcinolone cream (KENALOG) 0.1 % APPLY TOPICALLY TO AFFECTED AREA(S) TWICE DAILY AS DIRECTED 02/20/21   [provider]  vitamin B-12 (CYANOCOBALAMIN) 1000 MCG tablet Take 1 tablet by mouth daily.    [provider]  Vitamin E 670 MG (1000 UT) CAPS Take 1,000 Units by mouth daily.    [provider]  zinc gluconate 50 MG tablet Take 1 tablet by mouth daily.    [provider]      Allergies    Aspirin, Indomethacin, Other, and Pantoprazole    Review of Systems   Review of Systems  All other systems reviewed and are negative.   Physical Exam Updated Vital Signs BP (!) 194/76 (BP Location: Right Arm)   Pulse (!) 57   Temp (!) 97.5  F (36.4 C) (Oral)   Resp 18   Ht 5\' 5"  (1.651 m)   Wt 64 kg   SpO2 99%   BMI 23.46 kg/m  Physical Exam Vitals and nursing note reviewed.  Constitutional:      General: She is not in acute distress.    Appearance: She is well-developed.  HENT:     Head: Normocephalic and atraumatic.  Eyes:     Conjunctiva/sclera: Conjunctivae normal.  Cardiovascular:     Rate and Rhythm: Normal rate and regular rhythm.  Pulmonary:     Effort: Pulmonary effort is normal. No respiratory distress.     Breath sounds: Normal breath sounds.  Abdominal:     Palpations: Abdomen is soft.      Tenderness: There is no abdominal tenderness.  Musculoskeletal:        General: No swelling.     Cervical back: Neck supple.  Skin:    General: Skin is warm and dry.     Capillary Refill: Capillary refill takes less than 2 seconds.  Neurological:     Mental Status: She is alert.     Comments: MENTAL STATUS EXAM:    Orientation: Alert and oriented to person, place and time.  Memory: Cooperative, follows commands well.  Language: Speech is clear and language is normal.   CRANIAL NERVES:    CN 2 (Optic): Visual fields intact to confrontation.  CN 3,4,6 (EOM): Pupils equal and reactive to light. Full extraocular eye movement without nystagmus.  CN 5 (Trigeminal): Facial sensation is normal, no weakness of masticatory muscles.  CN 7 (Facial): No facial weakness or asymmetry.  CN 8 (Auditory): Auditory acuity grossly normal.  CN 9,10 (Glossophar): The uvula is midline, the palate elevates symmetrically.  CN 11 (spinal access): Normal sternocleidomastoid and trapezius strength.  CN 12 (Hypoglossal): The tongue is midline. No atrophy or fasciculations.Marland Kitchen   MOTOR:  Muscle Strength: 5/5RUE, 5/5LUE, 5/5RLE, 5/5LLE.   COORDINATION:   Intact finger-to-nose, no tremor.   SENSATION:   Intact to light touch all four extremities.     Psychiatric:        Attention and Perception: She perceives visual hallucinations.        Mood and Affect: Mood normal.        Thought Content: Thought content is not paranoid or delusional.     ED Results / Procedures / Treatments   Labs (all labs ordered are listed, but only abnormal results are displayed) Labs Reviewed - No data to display  EKG None  Radiology No results found.  Procedures Procedures    Medications Ordered in ED Medications - No data to display  ED Course/ Medical Decision Making/ A&P                                 Medical Decision Making Amount and/or Complexity of Data Reviewed Labs: ordered. Radiology:  ordered.    89 year old female with medical history significant for anemia, duodenal ulcer with hemorrhage, breast cancer, hypertension, hypertriglyceridemia, macular degeneration presenting to the emergency department with a chief complaint of hallucinations.  The patient denies any vision loss compared to her baseline poor vision in her right eye.  She states that she intermittently sees spots in her vision chronically.  She states that over the past few days since 02/05/2024 she has been hallucinating and seeing objects and people that are not there.  She has been seeing bushes as well as  neighbors that do not exist.  No known infectious symptoms.  No headache.  No focal weakness or numbness.  No chest pain, shortness of breath, genitourinary symptoms, abdominal pain.  No SI or HI.  No recent medication changes.  She is on Klonopin outpatient.  On arrival, the patient was afebrile, temperature 97.5, not tachycardic, mildly bradycardic heart rate 57 noted on cardiac telemetry, RR 18, BP hypertensive 194/76, saturating 99% on room air.  ***  {Document critical care time when appropriate:1} {Document review of labs and clinical decision tools ie heart score, Chads2Vasc2 etc:1}  {Document your independent review of radiology images, and any outside records:1} {Document your discussion with family members, caretakers, and with consultants:1} {Document social determinants of health affecting pt's care:1} {Document your decision making why or why not admission, treatments were needed:1} Final Clinical Impression(s) / ED Diagnoses Final diagnoses:  None    Rx / DC Orders ED Discharge Orders     None

## 2024-02-09 NOTE — ED Triage Notes (Signed)
 In for eval of "seeing things that are not there" onset 02/05/2024. Reports having macular degeneration. Has mammogram on 02/04/2024 and it started the next day. Denies eye pain or headaches. Reports seeing black spots in her vision and trees or bushes that are not there.

## 2024-02-09 NOTE — ED Notes (Signed)
 Pt transported with [facility] by stretcher.

## 2024-02-09 NOTE — ED Notes (Addendum)
 Pt can not see anything on Visual Acuity Board with glasses or without  Pt said all she see is black dots.

## 2024-02-09 NOTE — Plan of Care (Signed)
 Called for direct admit for confusion and hallucinations. Has been given Ceftriaxone in the ED. No agitation. Felt that UTI is causing her confusion. BP is slightly elevated and home meds are going to be resumed. Will place her in observation status.   Denise Newsom, MD

## 2024-02-10 DIAGNOSIS — E781 Pure hyperglyceridemia: Secondary | ICD-10-CM | POA: Diagnosis present

## 2024-02-10 DIAGNOSIS — Z90710 Acquired absence of both cervix and uterus: Secondary | ICD-10-CM | POA: Diagnosis not present

## 2024-02-10 DIAGNOSIS — N390 Urinary tract infection, site not specified: Secondary | ICD-10-CM | POA: Diagnosis present

## 2024-02-10 DIAGNOSIS — Z87891 Personal history of nicotine dependence: Secondary | ICD-10-CM | POA: Diagnosis not present

## 2024-02-10 DIAGNOSIS — F0283 Dementia in other diseases classified elsewhere, unspecified severity, with mood disturbance: Secondary | ICD-10-CM | POA: Diagnosis present

## 2024-02-10 DIAGNOSIS — I160 Hypertensive urgency: Secondary | ICD-10-CM | POA: Insufficient documentation

## 2024-02-10 DIAGNOSIS — G4700 Insomnia, unspecified: Secondary | ICD-10-CM | POA: Diagnosis present

## 2024-02-10 DIAGNOSIS — Z79811 Long term (current) use of aromatase inhibitors: Secondary | ICD-10-CM | POA: Diagnosis not present

## 2024-02-10 DIAGNOSIS — R441 Visual hallucinations: Secondary | ICD-10-CM | POA: Diagnosis present

## 2024-02-10 DIAGNOSIS — Z8619 Personal history of other infectious and parasitic diseases: Secondary | ICD-10-CM | POA: Diagnosis not present

## 2024-02-10 DIAGNOSIS — F0282 Dementia in other diseases classified elsewhere, unspecified severity, with psychotic disturbance: Secondary | ICD-10-CM | POA: Diagnosis present

## 2024-02-10 DIAGNOSIS — E7800 Pure hypercholesterolemia, unspecified: Secondary | ICD-10-CM | POA: Diagnosis present

## 2024-02-10 DIAGNOSIS — Z79899 Other long term (current) drug therapy: Secondary | ICD-10-CM | POA: Diagnosis not present

## 2024-02-10 DIAGNOSIS — R001 Bradycardia, unspecified: Secondary | ICD-10-CM | POA: Diagnosis present

## 2024-02-10 DIAGNOSIS — F419 Anxiety disorder, unspecified: Secondary | ICD-10-CM | POA: Diagnosis present

## 2024-02-10 DIAGNOSIS — N3000 Acute cystitis without hematuria: Secondary | ICD-10-CM | POA: Diagnosis not present

## 2024-02-10 DIAGNOSIS — E876 Hypokalemia: Secondary | ICD-10-CM | POA: Diagnosis present

## 2024-02-10 DIAGNOSIS — Z923 Personal history of irradiation: Secondary | ICD-10-CM | POA: Diagnosis not present

## 2024-02-10 DIAGNOSIS — G309 Alzheimer's disease, unspecified: Secondary | ICD-10-CM | POA: Diagnosis present

## 2024-02-10 DIAGNOSIS — Z803 Family history of malignant neoplasm of breast: Secondary | ICD-10-CM | POA: Diagnosis not present

## 2024-02-10 DIAGNOSIS — Z8711 Personal history of peptic ulcer disease: Secondary | ICD-10-CM | POA: Diagnosis not present

## 2024-02-10 DIAGNOSIS — I10 Essential (primary) hypertension: Secondary | ICD-10-CM | POA: Diagnosis present

## 2024-02-10 DIAGNOSIS — B961 Klebsiella pneumoniae [K. pneumoniae] as the cause of diseases classified elsewhere: Secondary | ICD-10-CM | POA: Diagnosis present

## 2024-02-10 DIAGNOSIS — H3530 Unspecified macular degeneration: Secondary | ICD-10-CM | POA: Diagnosis present

## 2024-02-10 DIAGNOSIS — E119 Type 2 diabetes mellitus without complications: Secondary | ICD-10-CM | POA: Diagnosis present

## 2024-02-10 DIAGNOSIS — Z853 Personal history of malignant neoplasm of breast: Secondary | ICD-10-CM | POA: Diagnosis not present

## 2024-02-10 LAB — CBC
HCT: 37.8 % (ref 36.0–46.0)
Hemoglobin: 12.7 g/dL (ref 12.0–15.0)
MCH: 31.7 pg (ref 26.0–34.0)
MCHC: 33.6 g/dL (ref 30.0–36.0)
MCV: 94.3 fL (ref 80.0–100.0)
Platelets: 240 10*3/uL (ref 150–400)
RBC: 4.01 MIL/uL (ref 3.87–5.11)
RDW: 12 % (ref 11.5–15.5)
WBC: 5.3 10*3/uL (ref 4.0–10.5)
nRBC: 0 % (ref 0.0–0.2)

## 2024-02-10 LAB — TSH: TSH: 1.99 u[IU]/mL (ref 0.350–4.500)

## 2024-02-10 LAB — PHOSPHORUS: Phosphorus: 3.1 mg/dL (ref 2.5–4.6)

## 2024-02-10 LAB — BASIC METABOLIC PANEL WITH GFR
Anion gap: 10 (ref 5–15)
BUN: 14 mg/dL (ref 8–23)
CO2: 24 mmol/L (ref 22–32)
Calcium: 9.3 mg/dL (ref 8.9–10.3)
Chloride: 102 mmol/L (ref 98–111)
Creatinine, Ser: 0.68 mg/dL (ref 0.44–1.00)
GFR, Estimated: >60 mL/min (ref >60)
Glucose, Bld: 122 mg/dL — ABNORMAL HIGH (ref 70–99)
Potassium: 3.3 mmol/L — ABNORMAL LOW (ref 3.5–5.1)
Sodium: 136 mmol/L (ref 135–145)

## 2024-02-10 LAB — MAGNESIUM: Magnesium: 2 mg/dL (ref 1.7–2.4)

## 2024-02-10 LAB — VITAMIN B12: Vitamin B-12: 197 pg/mL (ref 180–914)

## 2024-02-10 MED ORDER — ACETAMINOPHEN 500 MG PO TABS: 1000.0000 mg | ORAL_TABLET | Freq: Four times a day (QID) | ORAL | Status: DC | PRN

## 2024-02-10 MED ORDER — POLYETHYLENE GLYCOL 3350 17 G PO PACK
17.0000 g | PACK | Freq: Every day | ORAL | Status: DC | PRN
  Administered 2024-02-11: 17 g via ORAL
  Filled 2024-02-10: qty 1

## 2024-02-10 MED ORDER — ONDANSETRON HCL 4 MG/2ML IJ SOLN: 4.0000 mg | Freq: Four times a day (QID) | INTRAMUSCULAR | Status: DC | PRN

## 2024-02-10 MED ORDER — POTASSIUM CHLORIDE CRYS ER 20 MEQ PO TBCR
40.0000 meq | EXTENDED_RELEASE_TABLET | Freq: Once | ORAL | Status: AC
  Administered 2024-02-10: 40 meq via ORAL
  Filled 2024-02-10: qty 2

## 2024-02-10 MED ORDER — ENOXAPARIN SODIUM 40 MG/0.4ML IJ SOSY
40.0000 mg | PREFILLED_SYRINGE | INTRAMUSCULAR | Status: DC
  Administered 2024-02-10 – 2024-02-11 (×2): 40 mg via SUBCUTANEOUS
  Filled 2024-02-10 (×2): qty 0.4

## 2024-02-10 MED ORDER — MELATONIN 3 MG PO TABS
6.0000 mg | ORAL_TABLET | Freq: Every evening | ORAL | Status: DC | PRN
  Administered 2024-02-10: 6 mg via ORAL
  Filled 2024-02-10: qty 2

## 2024-02-10 MED ORDER — CYANOCOBALAMIN 1000 MCG/ML IJ SOLN
1000.0000 ug | Freq: Every day | INTRAMUSCULAR | Status: DC
  Administered 2024-02-10 – 2024-02-11 (×2): 1000 ug via INTRAMUSCULAR
  Filled 2024-02-10 (×2): qty 1

## 2024-02-10 NOTE — Plan of Care (Signed)

## 2024-02-10 NOTE — Evaluation (Signed)
 Physical Therapy Evaluation Patient Details Name: Denise Strickland MRN: [id number] DOB: [birth year] Today's Date: 02/10/2024  History of Present Illness  Denise Strickland is a 89 y.o. female who was transferred from [facility] ED  02/09/24 due to visual hallucinations associated with questionable UTI. PMH: Alzheimer's dementia, breast cancer stage Ia with history of lumpectomy, adjuvant radiation, hypertension, diabetes, hypertriglyceridemia, peptic ulcer disease, AMD.  Clinical Impression  Patient is independently ambulating, no   balance issues. PT will sign off.    Patient's family lives close by and able to provide trnasprotation assistance since vision changes are present.        If plan is discharge home, recommend the following: Direct supervision/assist for medications management   Can travel by private vehicle        Equipment Recommendations None recommended by PT  Recommendations for Other Services       Functional Status Assessment Patient has not had a recent decline in their functional status     Precautions / Restrictions Precautions Precaution/Restrictions Comments: vision deficits      Mobility  Bed Mobility Overal bed mobility: Independent                  Transfers Overall transfer level: Independent                      Ambulation/Gait Ambulation/Gait assistance: Independent Gait Distance (Feet): 400 Feet Assistive device: None Gait Pattern/deviations: WFL(Within Functional Limits)   Gait velocity interpretation: >2.62 ft/sec, indicative of community ambulatory      Stairs            Wheelchair Mobility     Tilt Bed    Modified Rankin (Stroke Patients Only)       Balance Overall balance assessment: Independent                                           Pertinent Vitals/Pain Pain Assessment Pain Assessment: No/denies pain    Home Living Family/patient expects to be discharged to:: Private  residence Living Arrangements: Alone Available Help at Discharge: Family;Available PRN/intermittently Type of Home: House Home Access: Stairs to enter Entrance Stairs-Rails: None Entrance Stairs-Number of Steps: 1 =1 Alternate Level Stairs-Number of Steps: 2 down to sunken den Home Layout: Multi-level Home Equipment: None      Prior Function Prior Level of Function : Independent/Modified Independent             Mobility Comments: has stopped driving ADLs Comments: Indep.     Extremity/Trunk Assessment   Upper Extremity Assessment Upper Extremity Assessment: Overall WFL for tasks assessed    Lower Extremity Assessment Lower Extremity Assessment: Overall WFL for tasks assessed    Cervical / Trunk Assessment Cervical / Trunk Assessment: Normal  Communication   Communication Communication: No apparent difficulties    Cognition Arousal: Alert Behavior During Therapy: WFL for tasks assessed/performed   PT - Cognitive impairments: No apparent impairments                         Following commands: Intact       Cueing       General Comments      Exercises     Assessment/Plan    PT Assessment Patient does not need any further PT services  PT Problem List  PT Treatment Interventions      PT Goals (Current goals can be found in the Care Plan section)  Acute Rehab PT Goals Patient Stated Goal: go home PT Goal Formulation: All assessment and education complete, DC therapy    Frequency       Co-evaluation               AM-PAC PT "6 Clicks" Mobility  Outcome Measure Help needed turning from your back to your side while in a flat bed without using bedrails?: None Help needed moving from lying on your back to sitting on the side of a flat bed without using bedrails?: None Help needed moving to and from a bed to a chair (including a wheelchair)?: None Help needed standing up from a chair using your arms (e.g., wheelchair or  bedside chair)?: None Help needed to walk in hospital room?: None Help needed climbing 3-5 steps with a railing? : None 6 Click Score: 24    End of Session   Activity Tolerance: Patient tolerated treatment well Patient left: in bed Nurse Communication: Mobility status      Time: 0454-0981 PT Time Calculation (min) (ACUTE ONLY): 14 min   Charges:   PT Evaluation $PT Eval Low Complexity: 1 Low   PT General Charges $$ ACUTE PT VISIT: 1 Visit         Abelina Hoes PT Acute Rehabilitation Services Office [phone]    Dareen Ebbing 02/10/2024, 9:53 AM

## 2024-02-10 NOTE — Progress Notes (Addendum)
 PROGRESS NOTE    Denise Strickland  ZOX:[id number] DOB: 02/05/1931 DOA: 02/09/2024 PCP: Richmond Campbell., PA-C   Brief Narrative:  89 y.o. female with hx of Alzheimer's dementia, breast cancer stage Ia with history of lumpectomy, adjuvant radiation, hypertension, diabetes, hypertriglyceridemia, peptic ulcer disease and macular degeneration presented with visual hallucinations.  On presentation, workup revealed possible UTI.  CT of the head without contrast was negative for acute intracranial process.  Chest x-ray was negative for any active disease.  She was started on IV antibiotics.  Assessment & Plan:   Possible UTI: Present on admission - Continue Rocephin.  Follow cultures.  Visual hallucinations -Possibly from UTI.  Still having some hallucinations but improving.  Might also be from worsening of dementia.  Might need outpatient neurology evaluation. - Imaging as above.  Fall precautions.  PT eval.  Possible vitamin B12 deficiency - Vitamin B12 197 which is at the lower limit of normal.  Start parenteral supplementation while inpatient.  Hypertensive urgency -Blood pressure improving.  Monitor.  Continue ramipril and hydrochlorothiazide along with amlodipine.  Hold metoprolol because of bradycardia  History of Alzheimer's dementia -Not on any meds.  Outpatient follow-up with PCP and/or neurology  Breast cancer stage Ia with history of lumpectomy and adjuvant radiation -Follow-up with oncology for surveillance.  Resume anastrozole on discharge.  Hypokalemia Replace.  Hypertriglyceridemia Continue gemfibrozil  Diabetes mellitus type 2 - Metformin on hold.  Carb modified diet.  Blood sugars currently controlled  History of macular degeneration -Outpatient follow-up with ophthalmology  Mood disorder - Continue home clonazepam nightly  DVT prophylaxis: Lovenox Code Status: Full Family Communication: None at bedside Disposition Plan: Status is: Observation The patient  will require care spanning > 2 midnights and should be moved to inpatient because: Of severity of illness.  Need for IV antibiotics.  Consultants: None  Procedures: None  Antimicrobials: Rocephin from 02/09/2024 onwards   Subjective: Patient seen and examined at bedside.  Awake, answers some questions appropriately and still having some visual hallucinations but improving.  No fever, seizures, agitation reported.  Objective: Vitals:   02/09/24 2034 02/09/24 2245 02/10/24 0233 02/10/24 0600  BP: (!) 219/79 (!) 177/75 (!) 143/76 (!) 161/66  Pulse: (!) 57 (!) 53 (!) 55 (!) 53  Resp: 17 18 18 18   Temp: 97.7 F (36.5 C) 98.4 F (36.9 C) 98 F (36.7 C) 98.3 F (36.8 C)  TempSrc: Oral Oral Oral Oral  SpO2: 100% 98% 95% 95%  Weight:      Height:        Intake/Output Summary (Last 24 hours) at 02/10/2024 1051 Last data filed at 02/10/2024 0853 Gross per 24 hour  Intake 337.68 ml  Output --  Net 337.68 ml   Filed Weights   02/09/24 1012  Weight: 64 kg    Examination:  General exam: Appears calm and comfortable.  Elderly female lying in bed. Respiratory system: Bilateral decreased breath sounds at bases Cardiovascular system: S1 & S2 heard, bradycardic intermittently  gastrointestinal system: Abdomen is nondistended, soft and nontender. Normal bowel sounds heard. Extremities: No cyanosis, clubbing, edema  Central nervous system: Alert and oriented.  Slow to respond.  Poor historian.  Answers some questions appropriately.  No focal neurological deficits. Moving extremities Skin: No rashes, lesions or ulcers Psychiatry: Flat affect.  Not agitated.    Data Reviewed: I have personally reviewed following labs and imaging studies  CBC: Recent Labs  Lab 02/09/24 1205 02/09/24 1215 02/10/24 0546  WBC 5.5  --  5.3  NEUTROABS 3.2  --   --   HGB 12.4 12.6 12.7  HCT 34.8* 37.0 37.8  MCV 91.3  --  94.3  PLT 244  --  240   Basic Metabolic Panel: Recent Labs  Lab  02/09/24 1205 02/09/24 1215 02/10/24 0546  NA 136 137 136  K 3.6 3.6 3.3*  CL 101  --  102  CO2 27  --  24  GLUCOSE 114*  --  122*  BUN 11  --  14  CREATININE 0.69  --  0.68  CALCIUM 9.0  --  9.3  MG  --   --  2.0  PHOS  --   --  3.1   GFR: Estimated Creatinine Clearance: 42.1 mL/min (by C-G formula based on SCr of 0.68 mg/dL). Liver Function Tests: Recent Labs  Lab 02/09/24 1205  AST 14*  ALT 8  ALKPHOS 36*  BILITOT 0.8  PROT 6.0*  ALBUMIN 4.0   No results for input(s): "LIPASE", "AMYLASE" in the last 168 hours. Recent Labs  Lab 02/09/24 1205  AMMONIA 10   Coagulation Profile: No results for input(s): "INR", "PROTIME" in the last 168 hours. Cardiac Enzymes: No results for input(s): "CKTOTAL", "CKMB", "CKMBINDEX", "TROPONINI" in the last 168 hours. BNP (last 3 results) No results for input(s): "PROBNP" in the last 8760 hours. HbA1C: No results for input(s): "HGBA1C" in the last 72 hours. CBG: Recent Labs  Lab 02/09/24 1141  GLUCAP 129*   Lipid Profile: No results for input(s): "CHOL", "HDL", "LDLCALC", "TRIG", "CHOLHDL", "LDLDIRECT" in the last 72 hours. Thyroid Function Tests: Recent Labs    02/09/24 1313 02/10/24 0546  TSH 1.555 1.990  FREET4 0.91  --    Anemia Panel: Recent Labs    02/10/24 0546  VITAMINB12 197   Sepsis Labs: No results for input(s): "PROCALCITON", "LATICACIDVEN" in the last 168 hours.  Recent Results (from the past 240 hours)  Urine Culture     Status: Abnormal (Preliminary result)   Collection Time: 02/09/24 12:05 PM   Specimen: Urine, Random  Result Value Ref Range Status   Specimen Description   Final    URINE, RANDOM Performed at [facility], [address], [place], [place] [address]    Special Requests   Final    NONE Reflexed from [id number] Performed at [facility], [address] [place], [place] [address]    Culture (A)  Final    >=100,000 COLONIES/mL Hillis Lu NEGATIVE  RODS SUSCEPTIBILITIES TO FOLLOW Performed at [facility], [address] [place], [place] [address]    Report Status PENDING  Incomplete         Radiology Studies: CT HEAD WO CONTRAST Result Date: 02/09/2024 CLINICAL DATA:  Mental status change EXAM: CT HEAD WITHOUT CONTRAST TECHNIQUE: Contiguous axial images were obtained from the base of the skull through the vertex without intravenous contrast. RADIATION DOSE REDUCTION: This exam was performed according to the departmental dose-optimization program which includes automated exposure control, adjustment of the mA and/or kV according to patient size and/or use of iterative reconstruction technique. COMPARISON:  None Available. FINDINGS: Brain: No evidence of acute infarction, hemorrhage, hydrocephalus, extra-axial collection or mass lesion/mass effect. There is mild diffuse atrophy. There is mild periventricular white matter hypodensity, likely chronic small vessel ischemic change. Vascular: Atherosclerotic calcifications are present within the cavernous internal carotid arteries. Skull: Normal. Negative for fracture or focal lesion. Sinuses/Orbits: No acute finding. Other: None. IMPRESSION: 1. No acute intracranial process. 2. Mild diffuse  atrophy and chronic small vessel ischemic change. Electronically Signed   By: Tyron Gallon M.D.   On: 02/09/2024 16:24   DG Chest Port 1 View Result Date: 02/09/2024 CLINICAL DATA:  Altered mental status. EXAM: PORTABLE CHEST 1 VIEW COMPARISON:  11/14/2008. FINDINGS: Bilateral lung fields are clear. Bilateral costophrenic angles are clear. Note is made of elevated right hemidiaphragm. Stable cardio-mediastinal silhouette. No acute osseous abnormalities. The soft tissues are within normal limits. IMPRESSION: No active disease. Electronically Signed   By: Beula Brunswick M.D.   On: 02/09/2024 11:38        Scheduled Meds:  amLODipine  5 mg Oral Daily   clonazePAM  1 mg Oral QHS   cyanocobalamin   1,000 mcg Intramuscular Daily   enoxaparin (LOVENOX) injection  40 mg Subcutaneous Q24H   gemfibrozil  600 mg Oral Daily   hydrochlorothiazide  25 mg Oral Daily   ramipril  10 mg Oral BID   Continuous Infusions:  cefTRIAXone (ROCEPHIN)  IV            Audria Leather, MD [facility] 02/10/2024, 10:51 AM

## 2024-02-10 NOTE — H&P (Signed)
 History and Physical    Denise Strickland ZOX:[id number] DOB: 08/19/1931 DOA: 02/09/2024  PCP: Lory Rough., PA-C   Patient coming from:  Tx from [facility] ED    Chief Complaint:  Chief Complaint  Patient presents with   Eye Problem    HPI:  Denise Strickland is a 89 y.o. female with hx of Alzheimer's dementia, breast cancer stage Ia with history of lumpectomy, adjuvant radiation, hypertension, diabetes, hypertriglyceridemia, peptic ulcer disease, AMD, who was transferred from [facility] ED due to visual hallucinations associated with questionable UTI.  Reports that she has never had hallucinations until approximately 1 week ago when she started developing formed hallucinations.  Describes looking at the window and seeing flashes that are not actually there, seeing a neighbor in her yard he is not actually there.  Also reports nonformed hallucinations including color splotches when she looks at the wall.  Denies any significant urinary changes.  Otherwise has been in her normal state of health with no complaints.  No recent change in medications although she did start taking an OTC supplement for brain health around the time her hallucinations started but she stopped this and it did not help.   Review of Systems:  ROS complete and negative except as marked above   Allergies  Allergen Reactions   Aspirin Other (See Comments)    Stomach ulcers unknown    Indomethacin    Other     Other reaction(s): GI Bleeding   Pantoprazole Nausea And Vomiting and Other (See Comments)    Stomach ulcers unknown     Prior to Admission medications   Medication Sig Start Date End Date Taking? Authorizing Provider  anastrozole (ARIMIDEX) 1 MG tablet TAKE 1 TABLET EVERY DAY 01/18/24  Yes Causey, Laura Polio, NP  ascorbic acid (VITAMIN C) 500 MG tablet Take 1 tablet by mouth daily.   Yes [provider]  clonazePAM (KLONOPIN) 1 MG tablet Take 1 tablet by mouth at bedtime. 01/21/24  Yes [provider]  Biotin 5000 MCG CAPS Take 5,000 mcg by mouth daily.    [provider]  Cholecalciferol 125 MCG (5000 UT) TABS Take 2,000 mg by mouth daily.    [provider]  diphenhydrAMINE (BENADRYL) 25 MG tablet Take 25 mg by mouth at bedtime as needed for sleep.    [provider]  ferrous sulfate 325 (65 FE) MG tablet Take 1 tablet by mouth daily with breakfast.    [provider]  gemfibrozil (LOPID) 600 MG tablet  01/02/20   [provider]  hydrochlorothiazide (HYDRODIURIL) 25 MG tablet TAKE 1 TABLET EVERY DAY 01/11/20   [provider]  metFORMIN (GLUCOPHAGE-XR) 500 MG 24 hr tablet Take 500 mg by mouth daily.    [provider]  metoprolol tartrate (LOPRESSOR) 50 MG tablet Take 50 mg by mouth 2 (two) times daily.    [provider]  Multiple Vitamins-Minerals (OCUVITE ADULT 50+ PO) Take 1 tablet by mouth daily.    [provider]  ramipril (ALTACE) 10 MG capsule  10/11/19   [provider]  triamcinolone cream (KENALOG) 0.1 % APPLY TOPICALLY TO AFFECTED AREA(S) TWICE DAILY AS DIRECTED 02/20/21   [provider]  vitamin B-12 (CYANOCOBALAMIN) 1000 MCG tablet Take 1 tablet by mouth daily.    [provider]  Vitamin E 670 MG (1000 UT) CAPS Take 1,000 Units by mouth daily.    [provider]  zinc gluconate 50 MG tablet Take 1 tablet by  mouth daily.    [provider]    Past Medical History:  Diagnosis Date   Allergy    Anxiety    Herpes simplex    from [facility] chart   Hypercholesterolemia    from [facility] chart   Hyperglycemia    from [facility] chart   Hypertension    Insomnia    from [facility] chart   Macular degeneration    Pre-diabetes     Past Surgical History:  Procedure Laterality Date   ABDOMINAL HYSTERECTOMY     BREAST LUMPECTOMY WITH RADIOACTIVE SEED LOCALIZATION Left 02/14/2020   Procedure: LEFT BREAST LUMPECTOMY WITH RADIOACTIVE SEED LOCALIZATION;   Surgeon: Manus Rudd, MD;  Location: [facility];  Service: General;  Laterality: Left;  LMA   CATARACT EXTRACTION     from [facility] chart   HEEL SPUR SURGERY Bilateral    from [facility] chart   RE-EXCISION OF BREAST LUMPECTOMY Left 02/29/2020   Procedure: RE-EXCISION OF LEFT BREAST LUMPECTOMY INFERIOR AND MEDIAL MARGINS;  Surgeon: Manus Rudd, MD;  Location: [facility];  Service: General;  Laterality: Left;  LMA     reports that she quit smoking about 62 years ago. Her smoking use included cigarettes. She has never used smokeless tobacco. She reports that she does not currently use alcohol. She reports that she does not use drugs.  Family History  Problem Relation Age of Onset   Colon cancer Mother 26   Breast cancer Sister 26   Asthma Brother      Physical Exam: Vitals:   02/09/24 1945 02/09/24 2034 02/09/24 2245 02/10/24 0233  BP: (!) 199/69 (!) 219/79 (!) 177/75 (!) 143/76  Pulse: (!) 58 (!) 57 (!) 53 (!) 55  Resp: 17 17 18 18   Temp: 98.1 F (36.7 C) 97.7 F (36.5 C) 98.4 F (36.9 C) 98 F (36.7 C)  TempSrc: Oral Oral Oral Oral  SpO2: 100% 100% 98% 95%  Weight:      Height:        Gen: Awake, alert, NAD, appears younger than stated age CV: Regular, normal S1, S2, no murmurs  Resp: Normal WOB, CTAB  Abd: Flat, normoactive, nontender MSK: Symmetric, no edema  Skin: No rashes or lesions to exposed skin  Neuro: Alert and interactive, fully oriented, CN II through XII intact, motor is 5 out of 5 and symmetric, sensation intact and equal to fine touch. Psych: euthymic, appropriate    Data review:   Labs reviewed, notable for:   Chemistry unremarkable, blood counts unremarkable CO2 within normal limits Ammonia within normal limits UA rare bacteria but pyuria Tox negative  Micro:  Results for orders placed or performed during the hospital encounter of 02/25/20  SARS CORONAVIRUS 2 (TAT 6-24 HRS) Nasopharyngeal Nasopharyngeal Swab      Status: None   Collection Time: 02/25/20  1:59 PM   Specimen: Nasopharyngeal Swab  Result Value Ref Range Status   SARS Coronavirus 2 NEGATIVE NEGATIVE Final    Comment: (NOTE) SARS-CoV-2 target nucleic acids are NOT DETECTED. The SARS-CoV-2 RNA is generally detectable in upper and lower respiratory specimens during the acute phase of infection. Negative results do not preclude SARS-CoV-2 infection, do not rule out co-infections with other pathogens, and should not be used as the sole basis for treatment or other patient management decisions. Negative results must be combined with clinical observations, patient history, and epidemiological information. The expected result is Negative. Fact Sheet for Patients: HairSlick.no Fact Sheet for Healthcare Providers: quierodirigir.com This  test is not yet approved or cleared by the [place] and  has been authorized for detection and/or diagnosis of SARS-CoV-2 by FDA under an Emergency Use Authorization (EUA). This EUA will remain  in effect (meaning this test can be used) for the duration of the COVID-19 declaration under Section 56 4(b)(1) of the Act, 21 U.S.C. section 360bbb-3(b)(1), unless the authorization is terminated or revoked sooner. Performed at [facility], [address], [place], [place] [address]     Imaging reviewed:  CT HEAD WO CONTRAST Result Date: 02/09/2024 CLINICAL DATA:  Mental status change EXAM: CT HEAD WITHOUT CONTRAST TECHNIQUE: Contiguous axial images were obtained from the base of the skull through the vertex without intravenous contrast. RADIATION DOSE REDUCTION: This exam was performed according to the departmental dose-optimization program which includes automated exposure control, adjustment of the mA and/or kV according to patient size and/or use of iterative reconstruction technique. COMPARISON:  None Available. FINDINGS: Brain: No evidence  of acute infarction, hemorrhage, hydrocephalus, extra-axial collection or mass lesion/mass effect. There is mild diffuse atrophy. There is mild periventricular white matter hypodensity, likely chronic small vessel ischemic change. Vascular: Atherosclerotic calcifications are present within the cavernous internal carotid arteries. Skull: Normal. Negative for fracture or focal lesion. Sinuses/Orbits: No acute finding. Other: None. IMPRESSION: 1. No acute intracranial process. 2. Mild diffuse atrophy and chronic small vessel ischemic change. Electronically Signed   By: Darliss Cheney M.D.   On: 02/09/2024 16:24   DG Chest Port 1 View Result Date: 02/09/2024 CLINICAL DATA:  Altered mental status. EXAM: PORTABLE CHEST 1 VIEW COMPARISON:  11/14/2008. FINDINGS: Bilateral lung fields are clear. Bilateral costophrenic angles are clear. Note is made of elevated right hemidiaphragm. Stable cardio-mediastinal silhouette. No acute osseous abnormalities. The soft tissues are within normal limits. IMPRESSION: No active disease. Electronically Signed   By: Jules Schick M.D.   On: 02/09/2024 11:38     ED Course:  Treated with ceftriaxone for UTI, metoprolol IV for her blood pressure.   Assessment/Plan:  89 y.o. female with hx Alzheimer's dementia, breast cancer stage Ia with history of lumpectomy, adjuvant radiation, hypertension, diabetes, hypertriglyceridemia, peptic ulcer disease, AMD, who was transferred from [facility] ED due to visual hallucinations associated with questionable UTI.   Acute onset of visual hallucination 1 week of formed and nonformed hallucinations without prior history.  There is no encephalopathy on my evaluation, she is fully oriented and no focal neurologic deficits.  Possibly may be related to urinary tract infection.  Other considerations would be dementia variant like Lewy body with hallucinations, or charles bonnet syndrome related to visual loss from AMD.  Discussed approach that we will  treat underlying urinary tract infection if she is still having ongoing hallucinations then to follow-up with her outpatient providers may refer her to neurology.  -Management of urinary tract infection per below  Suspected urinary tract infection UA with rare bacteria with pyuria, limited urinary symptoms although her hallucinations possibly could be a manifestation of underlying infection. - Continue ceftriaxone 1 g IV every 24 hours for now, follow urine culture.  Should be able to be discharged with oral antibiotics today as long as mental status remains stable.  Hypertensive urgency:  Blood pressure uncontrolled, max 200s over 160s.  - Resume her home ramipril 10 mg twice daily, hydrochlorothiazide 25 mg daily, metoprolol 50 mg twice daily - Started on amlodipine 5 mg daily due to uncontrolled blood pressure, discussed with patient and she is amenable. - As needed  hydralazine 10 mg IV every 8 hours as needed for SBP greater than 180.  Avoid beta-blocker prn due to bradycardia  Chronic medical problems: History of Alzheimer's dementia: Reported recent oncology notes, not on medication. Breast cancer stage Ia history lumpectomy, adjuvant radiation: Follow-up with oncology for surveillance.  Holding anastrozole while inpatient. Diabetes type 2: Home regimen includes metformin.  Not currently requiring SSI, can add if she runs 5. Hypertriglyceridemia: Continue home gemfibrozil Peptic ulcer disease: Not on PPI History AMD: Follow-up with ophthalmology outpatient Mood disorder: Continue home clonazepam nightly  Body mass index is 23.46 kg/m.    DVT prophylaxis:  Lovenox Code Status:  Full Code Diet:  Diet Orders (From admission, onward)    None      Family Communication:  No   Consults:  None   Admission status:   Observation, Med-Surg  Severity of Illness: The appropriate patient status for this patient is OBSERVATION. Observation status is judged to be reasonable and necessary  in order to provide the required intensity of service to ensure the patient's safety. The patient's presenting symptoms, physical exam findings, and initial radiographic and laboratory data in the context of their medical condition is felt to place them at decreased risk for further clinical deterioration. Furthermore, it is anticipated that the patient will be medically stable for discharge from the hospital within 2 midnights of admission.    Arnulfo Larch, MD [facility]  How to contact the [facility] Attending or Consulting provider 7A - 7P or covering provider during after hours 7P -7A, for this patient.  Check the care team in [facility] and look for a) attending/consulting [facility] provider listed and b) the [facility] team listed Log into [email or web address] and use [facility] universal password to access. If you do not have the password, please contact the hospital operator. Locate the [facility] provider you are looking for under [facility] and page to a number that you can be directly reached. If you still have difficulty reaching the provider, please page the [facility] (Director on Call) for the Hospitalists listed on amion for assistance.  02/10/2024, 3:24 AM

## 2024-02-11 ENCOUNTER — Other Ambulatory Visit (HOSPITAL_COMMUNITY): Payer: Self-pay

## 2024-02-11 DIAGNOSIS — N3000 Acute cystitis without hematuria: Secondary | ICD-10-CM | POA: Diagnosis not present

## 2024-02-11 LAB — URINE CULTURE: Culture: >=100000 — AB

## 2024-02-11 MED ORDER — VITAMIN B-12 1000 MCG PO TABS: 2000.0000 ug | ORAL_TABLET | Freq: Every day | ORAL | Status: AC

## 2024-02-11 MED ORDER — AMLODIPINE BESYLATE 5 MG PO TABS
5.0000 mg | ORAL_TABLET | Freq: Every day | ORAL | 0 refills | Status: AC
  Filled 2024-02-11: qty 30, 30d supply, fill #0

## 2024-02-11 MED ORDER — CEPHALEXIN 500 MG PO CAPS
500.0000 mg | ORAL_CAPSULE | Freq: Three times a day (TID) | ORAL | 0 refills | Status: AC
  Filled 2024-02-11: qty 15, 5d supply, fill #0

## 2024-02-11 NOTE — Plan of Care (Signed)

## 2024-02-11 NOTE — Progress Notes (Signed)
 AVS reviewed w/ patient in room and also w/ Mylinda Asa, her granddaughter, via the phone, Medication changes reviewed w/ Mylinda Asa and marked on AVS. Pt's son Lavonia Powers will call when her arrives at the main entrance. This RN reinforced via phone that it would be helpful for a family member to stay with patient at night and check on her throughout the day due to some recent memory issues. Pt's son , Lavonia Powers, lives behind his mom in another house. Mylinda Asa confirmed patient does not drive. Pt was attempting to leave with her PIV intact prior to this RN reviewing AVS. Pt stated her son was here to pick her up when he was still 30 minutes away. Pt agreed to wait in room.

## 2024-02-11 NOTE — Discharge Summary (Signed)
 Physician Discharge Summary  Denise Strickland VWU:[id number] DOB: 17-Aug-1931 DOA: 02/09/2024  PCP: Lory Rough., PA-C  Admit date: 02/09/2024 Discharge date: 02/11/2024  Admitted From: Home Disposition: Home  Recommendations for Outpatient Follow-up:  Follow up with PCP in 1 week with repeat CBC/BMP Outpatient evaluation and follow-up by neurology Follow up in ED if symptoms worsen or new appear   Home Health: No Equipment/Devices: None  Discharge Condition: Stable CODE STATUS: Full Diet recommendation: Heart healthy  Brief/Interim Summary: 89 y.o. female with hx of Alzheimer's dementia, breast cancer stage Ia with history of lumpectomy, adjuvant radiation, hypertension, diabetes, hypertriglyceridemia, peptic ulcer disease and macular degeneration presented with visual hallucinations.  On presentation, workup revealed possible UTI.  CT of the head without contrast was negative for acute intracranial process.  Chest x-ray was negative for any active disease.  She was started on IV antibiotics.  Given the hospitalization, her condition is improving.  Urine culture grew Klebsiella pneumoniae.  She is currently hemodynamically stable and has tolerated PT.  Blood pressure is improving.  Initial dizziness are improving.  She will be discharged home today on oral Keflex.  Discharge Diagnoses:   Possible Klebsiella pneumoniae UTI: Present on admission - Currently on Rocephin.  She is currently hemodynamically stable and afebrile. She will be discharged home today on oral Keflex.   Visual hallucinations -Possibly from UTI.  Still having some hallucinations but improving.  Might also be from worsening of dementia.  Might need outpatient neurology evaluation. - Imaging as above.  Has tolerated PT.  Possible vitamin B12 deficiency - Vitamin B12 197 which is at the lower limit of normal.  Received parenteral supplementation while inpatient.  Increase dose of outpatient supplementation to 2000  mcg/day.  Outpatient follow-up with PCP.    Hypertensive urgency -Blood pressure improving.  Monitor.  Continue ramipril and hydrochlorothiazide along with amlodipine.  Hold metoprolol because of bradycardia.  Outpatient follow-up with PCP.   History of Alzheimer's dementia -Not on any meds.  Outpatient follow-up with PCP and/or neurology   Breast cancer stage Ia with history of lumpectomy and adjuvant radiation -Follow-up with oncology for surveillance.  Resume anastrozole on discharge.   Hypokalemia -No labs today   Hypertriglyceridemia -Continue gemfibrozil   Diabetes mellitus type 2 - Metformin elevated.  Carb modified diet.  Outpatient follow-up with PCP  History of macular degeneration -Outpatient follow-up with ophthalmology   Mood disorder - Continue home clonazepam nightly   Discharge Instructions   Allergies as of 02/11/2024       Reactions   Aspirin Other (See Comments)   Stomach ulcers   Indomethacin Other (See Comments)   Patient does not recall   Pantoprazole Nausea And Vomiting, Other (See Comments)   Stomach ulcers        Medication List     STOP taking these medications    Biotin 5000 MCG Caps   metoprolol tartrate 50 MG tablet Commonly known as: LOPRESSOR       TAKE these medications    amLODipine 5 MG tablet Commonly known as: NORVASC Take 1 tablet (5 mg total) by mouth daily.   anastrozole 1 MG tablet Commonly known as: ARIMIDEX TAKE 1 TABLET EVERY DAY   ascorbic acid 500 MG tablet Commonly known as: VITAMIN C Take 1 tablet by mouth daily.   cephALEXin 500 MG capsule Commonly known as: KEFLEX Take 1 capsule (500 mg total) by mouth 3 (three) times daily for 5 days.   Cholecalciferol 125 MCG (5000 UT) Tabs  Take 5,000 Units by mouth daily.   clonazePAM 1 MG tablet Commonly known as: KLONOPIN Take 1 tablet by mouth at bedtime.   cyanocobalamin 1000 MCG tablet Commonly known as: VITAMIN B12 Take 2 tablets (2,000 mcg  total) by mouth daily. What changed: how much to take   ferrous sulfate 325 (65 FE) MG tablet Take 1 tablet by mouth daily with breakfast.   gemfibrozil 600 MG tablet Commonly known as: LOPID Take 600 mg by mouth daily at 12 noon.   hydrochlorothiazide 25 MG tablet Commonly known as: HYDRODIURIL Take 25 mg by mouth daily.   metFORMIN 500 MG 24 hr tablet Commonly known as: GLUCOPHAGE-XR Take 500 mg by mouth daily.   OCUVITE ADULT 50+ PO Take 1 tablet by mouth daily.   polyethylene glycol 17 g packet Commonly known as: MIRALAX / GLYCOLAX Take 17 g by mouth daily.   ramipril 10 MG capsule Commonly known as: ALTACE Take 10 mg by mouth 2 (two) times daily.   Vitamin E 670 MG (1000 UT) Caps Take 1,000 Units by mouth daily.   zinc gluconate 50 MG tablet Take 1 tablet by mouth daily.        Follow-up Information     Lory Rough., PA-C. Schedule an appointment as soon as possible for a visit in 1 week(s).   Specialty: Family Medicine Contact information: [address] [place] [address] [phone]                Allergies  Allergen Reactions   Aspirin Other (See Comments)    Stomach ulcers   Indomethacin Other (See Comments)    Patient does not recall   Pantoprazole Nausea And Vomiting and Other (See Comments)    Stomach ulcers     Consultations: None   Procedures/Studies: CT HEAD WO CONTRAST Result Date: 02/09/2024 CLINICAL DATA:  Mental status change EXAM: CT HEAD WITHOUT CONTRAST TECHNIQUE: Contiguous axial images were obtained from the base of the skull through the vertex without intravenous contrast. RADIATION DOSE REDUCTION: This exam was performed according to the departmental dose-optimization program which includes automated exposure control, adjustment of the mA and/or kV according to patient size and/or use of iterative reconstruction technique. COMPARISON:  None Available. FINDINGS: Brain: No evidence of acute infarction,  hemorrhage, hydrocephalus, extra-axial collection or mass lesion/mass effect. There is mild diffuse atrophy. There is mild periventricular white matter hypodensity, likely chronic small vessel ischemic change. Vascular: Atherosclerotic calcifications are present within the cavernous internal carotid arteries. Skull: Normal. Negative for fracture or focal lesion. Sinuses/Orbits: No acute finding. Other: None. IMPRESSION: 1. No acute intracranial process. 2. Mild diffuse atrophy and chronic small vessel ischemic change. Electronically Signed   By: Tyron Gallon M.D.   On: 02/09/2024 16:24   DG Chest Port 1 View Result Date: 02/09/2024 CLINICAL DATA:  Altered mental status. EXAM: PORTABLE CHEST 1 VIEW COMPARISON:  11/14/2008. FINDINGS: Bilateral lung fields are clear. Bilateral costophrenic angles are clear. Note is made of elevated right hemidiaphragm. Stable cardio-mediastinal silhouette. No acute osseous abnormalities. The soft tissues are within normal limits. IMPRESSION: No active disease. Electronically Signed   By: Beula Brunswick M.D.   On: 02/09/2024 11:38      Subjective: Patient seen and examined at bedside.  Feels okay to go home today.  No fever, vomiting, seizures reported.  Hallucinations are improving.  Discharge Exam: Vitals:   02/10/24 2046 02/11/24 0530  BP: (!) 150/75 (!) 169/76  Pulse: 61 61  Resp: 20 18  Temp: 98.3 F (36.8 C) 98 F (36.7 C)  SpO2: 99% 95%    General: Pt is alert, awake, not in acute distress.  Slow to respond.  Poor historian.  Answers some questions appropriately. Cardiovascular: rate controlled, S1/S2 + Respiratory: bilateral decreased breath sounds at bases Abdominal: Soft, NT, ND, bowel sounds + Extremities: no edema, no cyanosis    The results of significant diagnostics from this hospitalization (including imaging, microbiology, ancillary and laboratory) are listed below for reference.     Microbiology: Recent Results (from the past 240  hours)  Urine Culture     Status: Abnormal   Collection Time: 02/09/24 12:05 PM   Specimen: Urine, Random  Result Value Ref Range Status   Specimen Description   Final    URINE, RANDOM Performed at [facility], [address], [place], [place] [address]    Special Requests   Final    NONE Reflexed from [id number] Performed at [facility], [address] [place], [place] [address]    Culture >=100,000 COLONIES/mL KLEBSIELLA PNEUMONIAE (A)  Final   Report Status 02/11/2024 FINAL  Final   Organism ID, Bacteria KLEBSIELLA PNEUMONIAE (A)  Final      Susceptibility   Klebsiella pneumoniae - MIC*    AMPICILLIN RESISTANT Resistant     CEFAZOLIN <=4 SENSITIVE Sensitive     CEFEPIME <=0.12 SENSITIVE Sensitive     CEFTRIAXONE <=0.25 SENSITIVE Sensitive     CIPROFLOXACIN <=0.25 SENSITIVE Sensitive     GENTAMICIN <=1 SENSITIVE Sensitive     IMIPENEM <=0.25 SENSITIVE Sensitive     NITROFURANTOIN 64 INTERMEDIATE Intermediate     TRIMETH/SULFA <=20 SENSITIVE Sensitive     AMPICILLIN/SULBACTAM <=2 SENSITIVE Sensitive     PIP/TAZO <=4 SENSITIVE Sensitive ug/mL    * >=100,000 COLONIES/mL KLEBSIELLA PNEUMONIAE     Labs: BNP (last 3 results) No results for input(s): "BNP" in the last 8760 hours. Basic Metabolic Panel: Recent Labs  Lab 02/09/24 1205 02/09/24 1215 02/10/24 0546  NA 136 137 136  K 3.6 3.6 3.3*  CL 101  --  102  CO2 27  --  24  GLUCOSE 114*  --  122*  BUN 11  --  14  CREATININE 0.69  --  0.68  CALCIUM 9.0  --  9.3  MG  --   --  2.0  PHOS  --   --  3.1   Liver Function Tests: Recent Labs  Lab 02/09/24 1205  AST 14*  ALT 8  ALKPHOS 36*  BILITOT 0.8  PROT 6.0*  ALBUMIN 4.0   No results for input(s): "LIPASE", "AMYLASE" in the last 168 hours. Recent Labs  Lab 02/09/24 1205  AMMONIA 10   CBC: Recent Labs  Lab 02/09/24 1205 02/09/24 1215 02/10/24 0546  WBC 5.5  --  5.3  NEUTROABS 3.2  --   --   HGB 12.4 12.6 12.7   HCT 34.8* 37.0 37.8  MCV 91.3  --  94.3  PLT 244  --  240   Cardiac Enzymes: No results for input(s): "CKTOTAL", "CKMB", "CKMBINDEX", "TROPONINI" in the last 168 hours. BNP: Invalid input(s): "POCBNP" CBG: Recent Labs  Lab 02/09/24 1141  GLUCAP 129*   D-Dimer No results for input(s): "DDIMER" in the last 72 hours. Hgb A1c No results for input(s): "HGBA1C" in the last 72 hours. Lipid Profile No results for input(s): "CHOL", "HDL", "LDLCALC", "TRIG", "CHOLHDL", "LDLDIRECT" in the last 72 hours. Thyroid function studies Recent Labs    02/10/24 0546  TSH 1.990   Anemia work up Recent Labs    02/10/24 0546  VITAMINB12 197   Urinalysis    Component Value Date/Time   COLORURINE YELLOW 02/09/2024 1205   APPEARANCEUR CLEAR 02/09/2024 1205   LABSPEC 1.011 02/09/2024 1205   PHURINE 6.5 02/09/2024 1205   GLUCOSEU NEGATIVE 02/09/2024 1205   HGBUR NEGATIVE 02/09/2024 1205   BILIRUBINUR NEGATIVE 02/09/2024 1205   KETONESUR NEGATIVE 02/09/2024 1205   PROTEINUR NEGATIVE 02/09/2024 1205   UROBILINOGEN 1.0 11/14/2008 0801   NITRITE POSITIVE (A) 02/09/2024 1205   LEUKOCYTESUR LARGE (A) 02/09/2024 1205   Sepsis Labs Recent Labs  Lab 02/09/24 1205 02/10/24 0546  WBC 5.5 5.3   Microbiology Recent Results (from the past 240 hours)  Urine Culture     Status: Abnormal   Collection Time: 02/09/24 12:05 PM   Specimen: Urine, Random  Result Value Ref Range Status   Specimen Description   Final    URINE, RANDOM Performed at [facility], [address] [place], [place] [address]    Special Requests   Final    NONE Reflexed from [id number] Performed at [facility], [address], [place], [place] [address]    Culture >=100,000 COLONIES/mL KLEBSIELLA PNEUMONIAE (A)  Final   Report Status 02/11/2024 FINAL  Final   Organism ID, Bacteria KLEBSIELLA PNEUMONIAE (A)  Final      Susceptibility   Klebsiella pneumoniae - MIC*    AMPICILLIN  RESISTANT Resistant     CEFAZOLIN <=4 SENSITIVE Sensitive     CEFEPIME <=0.12 SENSITIVE Sensitive     CEFTRIAXONE <=0.25 SENSITIVE Sensitive     CIPROFLOXACIN <=0.25 SENSITIVE Sensitive     GENTAMICIN <=1 SENSITIVE Sensitive     IMIPENEM <=0.25 SENSITIVE Sensitive     NITROFURANTOIN 64 INTERMEDIATE Intermediate     TRIMETH/SULFA <=20 SENSITIVE Sensitive     AMPICILLIN/SULBACTAM <=2 SENSITIVE Sensitive     PIP/TAZO <=4 SENSITIVE Sensitive ug/mL    * >=100,000 COLONIES/mL KLEBSIELLA PNEUMONIAE     Time coordinating discharge: 35 minutes  SIGNED:   Audria Leather, MD  [facility] 02/11/2024, 8:06 AM

## 2024-02-11 NOTE — Plan of Care (Signed)

## 2024-05-16 ENCOUNTER — Ambulatory Visit: Admitting: Neurology

## 2024-05-16 ENCOUNTER — Encounter: Payer: Self-pay | Admitting: Neurology

## 2024-05-16 VITALS — BP 134/78 | Ht 65.0 in | Wt 149.0 lb

## 2024-05-16 DIAGNOSIS — G3184 Mild cognitive impairment, so stated: Secondary | ICD-10-CM | POA: Diagnosis not present

## 2024-05-16 DIAGNOSIS — H3530 Unspecified macular degeneration: Secondary | ICD-10-CM

## 2024-05-16 NOTE — Patient Instructions (Signed)
 Continue current medications Continue follow with your doctors Discussed ways of reducing the risk of developing dementia including keeping a good health, good diet, good exercise plan.  Return as needed.   There are well-accepted and sensible ways to reduce risk for Alzheimers disease and other degenerative brain disorders .  Exercise Daily Walk A daily 20 minute walk should be part of your routine. Disease related apathy can be a significant roadblock to exercise and the only way to overcome this is to make it a daily routine and perhaps have a reward at the end (something your loved one loves to eat or drink perhaps) or a personal trainer coming to the home can also be very useful. Most importantly, the patient is much more likely to exercise if the caregiver / spouse does it with him/her. In general a structured, repetitive schedule is best.  General Health: Any diseases which effect your body will effect your brain such as a pneumonia, urinary infection, blood clot, heart attack or stroke. Keep contact with your primary care doctor for regular follow ups.  Sleep. A good nights sleep is healthy for the brain. Seven hours is recommended. If you have insomnia or poor sleep habits we can give you some instructions. If you have sleep apnea wear your mask.  Diet: Eating a heart healthy diet is also a good idea; fish and poultry instead of red meat, nuts (mostly non-peanuts), vegetables, fruits, olive oil or canola oil (instead of butter), minimal salt (use other spices to flavor foods), whole grain rice, bread, cereal and pasta and wine in moderation.Research is now showing that the MIND diet, which is a combination of The Mediterranean diet and the DASH diet, is beneficial for cognitive processing and longevity. Information about this diet can be found in The MIND Diet, a book by Annitta Feeling, MS, RDN, and online at [employer]  Finances, Power of [address] and  Advance Directives: You should consider putting legal safeguards in place with regard to financial and medical decision making. While the spouse always has power of attorney for medical and financial issues in the absence of any form, you should consider what you want in case the spouse / caregiver is no longer around or capable of making decisions.

## 2024-05-16 NOTE — Assessment & Plan Note (Signed)
 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 Mammogram and ultrasound 02/04/2024: [facility]: Benign, Breast density category B, 4.5 cm seroma   She has developed Alzheimer's dementia as well as macular degeneration.    return to clinic in 1 year for follow-up

## 2024-05-16 NOTE — Progress Notes (Signed)
 [facility]  PATIENT: Denise Strickland DOB: 12-Mar-1931  REQUESTING CLINICIAN: Cheryle Page, MD HISTORY FROM: Patient/Chart review  REASON FOR VISIT: Memory loss    HISTORICAL  CHIEF COMPLAINT:  Chief Complaint  Patient presents with   Follow-up    Rm 12, NP, sh eis not sure why she is here, states HX of UTI.     HISTORY OF PRESENT ILLNESS:  This 89 year old woman past medical history of hypertension, hyperlipidemia, diabetes, macular degeneration, recurrent UTI who was referred to neurology for to Alzheimer disease following an admission in April.  Patient presented in April due to hallucinations.  She was aware of her hallucinations.  During her admission, she was noted to be forgetful but her workup indicated that she did have an UTI.  She tells me since discharge from the hospital she has been doing well, he still experiences hallucinations that she described as bushes are moving and sometimes feels like the ground is moving.  She does have a history of macular degeneration.   When it comes to her memory.  She tells me that she has been living independently since her husband died in 2000-05-29.  Her family including children and grandchildren live [place] same [place], [place] too far from her.  She is independent all of her activities of daily living daily living.  She was able to provide a clear history and tells me that she has never been diagnosed with dementia.   TBI:   No past history of TBI Stroke:   no past history of stroke Seizures:   no past history of seizures Sleep:   no history of sleep apnea.  Mood:   patient denies anxiety and depression Family history of Dementia:   Denies  Functional status: independent in all ADLs Patient lives with alone since 2000-05-29. But sons lives behind her house in the farm house. Grandchildren down the street  Cooking: no issues  Cleaning: no issues  Shopping: with family help Bathing: no issues Toileting: no issues  Driving:  stopping tue to macular degeneration  Bills: patient  Medications: patient  Ever left the stove on by accident?: denies Forget how to use items around the house?: denies Getting lost going to familiar places?: denies Forgetting loved ones names?: denies Word finding difficulty? denies Sleep: good    OTHER MEDICAL CONDITIONS: Hypertension, Hyperlipidemia, Diabetes, macular degeneration    REVIEW OF SYSTEMS: Full 14 system review of systems performed and negative with exception of: As noted in the HPI   ALLERGIES: Allergies  Allergen Reactions   Aspirin Other (See Comments)    Stomach ulcers   Indomethacin Other (See Comments)    Patient does not recall   Pantoprazole Nausea And Vomiting and Other (See Comments)    Stomach ulcers     HOME MEDICATIONS: Outpatient Medications Prior to Visit  Medication Sig Dispense Refill   amLODipine  (NORVASC ) 5 MG tablet Take 1 tablet (5 mg total) by mouth daily. 30 tablet 0   anastrozole  (ARIMIDEX ) 1 MG tablet TAKE 1 TABLET EVERY DAY 90 tablet 3   ascorbic acid (VITAMIN C) 500 MG tablet Take 1 tablet by mouth daily.     Cholecalciferol 125 MCG (5000 UT) TABS Take 5,000 Units by mouth daily.     clonazePAM  (KLONOPIN ) 1 MG tablet Take 1 tablet by mouth at bedtime.     cyanocobalamin  (VITAMIN B12) 1000 MCG tablet Take 2 tablets (2,000 mcg total) by mouth daily.     ferrous sulfate 325 (65  FE) MG tablet Take 1 tablet by mouth daily with breakfast.     gemfibrozil  (LOPID ) 600 MG tablet Take 600 mg by mouth daily at 12 noon.     hydrochlorothiazide  (HYDRODIURIL ) 25 MG tablet Take 25 mg by mouth daily.     metFORMIN (GLUCOPHAGE-XR) 500 MG 24 hr tablet Take 500 mg by mouth daily.     Multiple Vitamins-Minerals (OCUVITE ADULT 50+ PO) Take 1 tablet by mouth daily.     polyethylene glycol (MIRALAX  / GLYCOLAX ) 17 g packet Take 17 g by mouth daily.     ramipril  (ALTACE ) 10 MG capsule Take 10 mg by mouth 2 (two) times daily.     Vitamin E 670 MG (1000  UT) CAPS Take 1,000 Units by mouth daily.     zinc gluconate 50 MG tablet Take 1 tablet by mouth daily.     No facility-administered medications prior to visit.    PAST MEDICAL HISTORY: Past Medical History:  Diagnosis Date   Allergy    Anxiety    Herpes simplex    from [facility] chart   Hypercholesterolemia    from [facility] chart   Hyperglycemia    from [facility] chart   Hypertension    Insomnia    from [facility] chart   Macular degeneration    Pre-diabetes     PAST SURGICAL HISTORY: Past Surgical History:  Procedure Laterality Date   ABDOMINAL HYSTERECTOMY     BREAST LUMPECTOMY WITH RADIOACTIVE SEED LOCALIZATION Left 02/14/2020   Procedure: LEFT BREAST LUMPECTOMY WITH RADIOACTIVE SEED LOCALIZATION;  Surgeon: Belinda Cough, MD;  Location: [facility];  Service: General;  Laterality: Left;  LMA   CATARACT EXTRACTION     from [facility] chart   HEEL SPUR SURGERY Bilateral    from [facility] chart   RE-EXCISION OF BREAST LUMPECTOMY Left 02/29/2020   Procedure: RE-EXCISION OF LEFT BREAST LUMPECTOMY INFERIOR AND MEDIAL MARGINS;  Surgeon: Belinda Cough, MD;  Location: [facility];  Service: General;  Laterality: Left;  LMA    FAMILY HISTORY: Family History  Problem Relation Age of Onset   Colon cancer Mother 38   Breast cancer Sister 33   Asthma Brother     SOCIAL HISTORY: Social History   Socioeconomic History   Marital status: Widowed    Spouse name: Not on file   Number of children: Not on file   Years of education: Not on file   Highest education level: Not on file  Occupational History   Not on file  Tobacco Use   Smoking status: Former    Current packs/day: 0.00    Types: Cigarettes    Quit date: 01/31/1962    Years since quitting: 62.3   Smokeless tobacco: Never  Vaping Use   Vaping status: Never Used  Substance and Sexual Activity   Alcohol use: Not Currently   Drug use: Never   Sexual activity: Not on file  Other Topics Concern   Not on file   Social History Narrative   Right handed   Caffeine- 1 cup daily   Retired from [employer] Strain: Not on file  Food Insecurity: Low Risk  (02/12/2024)   Received from [facility]   Hunger Vital Sign    Within the past 12 months, you worried that your food would run out before you got money to buy more: Never true    Within the past 12 months, the food you bought just  didn't last and you didn't have money to get more. : Never true  Transportation Needs: No Transportation Needs (02/12/2024)   Received from [facility]    In the past 12 months, has lack of reliable transportation kept you from medical appointments, meetings, work or from getting things needed for daily living? : No  Physical Activity: Not on file  Stress: Not on file  Social Connections: Moderately Integrated (02/09/2024)   Social Connection and Isolation Panel    Frequency of Communication with Friends and Family: More than three times a week    Frequency of Social Gatherings with Friends and Family: Three times a week    Attends Religious Services: 1 to 4 times per year    Active Member of Clubs or Organizations: Yes    Attends Banker Meetings: 1 to 4 times per year    Marital Status: Widowed  Intimate Partner Violence: Not At Risk (02/09/2024)   Humiliation, Afraid, Rape, and Kick questionnaire    Fear of Current or Ex-Partner: No    Emotionally Abused: No    Physically Abused: No    Sexually Abused: No    PHYSICAL EXAM  GENERAL EXAM/CONSTITUTIONAL: Vitals:  Vitals:   05/16/24 1405  BP: 134/78  Weight: 149 lb (67.6 kg)  Height: 5' 5 (1.651 m)   Body mass index is 24.79 kg/m. Wt Readings from Last 3 Encounters:  05/16/24 149 lb (67.6 kg)  02/09/24 141 lb (64 kg)  05/25/23 143 lb 4.8 oz (65 kg)   Patient is in no distress; well developed, nourished and groomed; neck is supple  MUSCULOSKELETAL: Gait, strength, tone,  movements noted in Neurologic exam below  NEUROLOGIC: MENTAL STATUS:     05/16/2024    2:11 PM  MMSE - Mini Mental State Exam  Orientation to time 3  Orientation to Place 5  Registration 3  Attention/ Calculation 5  Recall 1  Language- name 2 objects 2  Language- repeat 1  Language- follow 3 step command 3  Language- read & follow direction 1  Write a sentence 1  Copy design 0  Total score 25    CRANIAL NERVE:  2nd, 3rd, 4th, 6th- visual fields full to confrontation, extraocular muscles intact, no nystagmus 5th - facial sensation symmetric 7th - facial strength symmetric 8th - hearing intact 9th - palate elevates symmetrically, uvula midline 11th - shoulder shrug symmetric 12th - tongue protrusion midline  MOTOR:  normal bulk and tone, full strength in the BUE, BLE  SENSORY:  normal and symmetric to light touch  COORDINATION:  finger-nose-finger, fine finger movements normal  GAIT/STATION:  normal   DIAGNOSTIC DATA (LABS, IMAGING, TESTING) - I reviewed patient records, labs, notes, testing and imaging myself where available.  Lab Results  Component Value Date   WBC 5.3 02/10/2024   HGB 12.7 02/10/2024   HCT 37.8 02/10/2024   MCV 94.3 02/10/2024   PLT 240 02/10/2024      Component Value Date/Time   NA 136 02/10/2024 0546   K 3.3 (L) 02/10/2024 0546   CL 102 02/10/2024 0546   CO2 24 02/10/2024 0546   GLUCOSE 122 (H) 02/10/2024 0546   BUN 14 02/10/2024 0546   CREATININE 0.68 02/10/2024 0546   CREATININE 0.83 02/01/2020 1225   CALCIUM 9.3 02/10/2024 0546   PROT 6.0 (L) 02/09/2024 1205   ALBUMIN 4.0 02/09/2024 1205   AST 14 (L) 02/09/2024 1205   AST 24 02/01/2020 1225   ALT 8 02/09/2024  1205   ALT 19 02/01/2020 1225   ALKPHOS 36 (L) 02/09/2024 1205   BILITOT 0.8 02/09/2024 1205   BILITOT 0.8 02/01/2020 1225   GFRNONAA >60 02/10/2024 0546   GFRNONAA >60 02/01/2020 1225   GFRAA >60 03/27/2020 1107   GFRAA >60 02/01/2020 1225   Lab Results   Component Value Date   CHOL  11/15/2008    93        ATP III CLASSIFICATION:  <200     mg/dL   Desirable  799-760  mg/dL   Borderline High  >=759    mg/dL   High          HDL 21 (L) 11/15/2008   LDLCALC  11/15/2008    47        Total Cholesterol/HDL:CHD Risk Coronary Heart Disease Risk Table                     Men   Women  1/2 Average Risk   3.4   3.3  Average Risk       5.0   4.4  2 X Average Risk   9.6   7.1  3 X Average Risk  23.4   11.0        Use the calculated Patient Ratio above and the CHD Risk Table to determine the patient's CHD Risk.        ATP III CLASSIFICATION (LDL):  <100     mg/dL   Optimal  899-870  mg/dL   Near or Above                    Optimal  130-159  mg/dL   Borderline  839-810  mg/dL   High  >809     mg/dL   Very High   TRIG 876 11/15/2008   CHOLHDL 4.4 11/15/2008   No results found for: HGBA1C Lab Results  Component Value Date   VITAMINB12 197 02/10/2024   Lab Results  Component Value Date   TSH 1.990 02/10/2024    Head CT 02/09/2024 1. No acute intracranial process. 2. Mild diffuse atrophy and chronic small vessel ischemic change    ASSESSMENT AND PLAN  89 y.o. year old female with history of hypertension, hyperlipidemia, macular degeneration, diabetes who was referred to neurology for dementia while she was admitted to the hospital for UTI.  During admission patient memory was impaired, she did report hallucinations but this was in the setting of an infection.  It is well-known that infection such as UTI can cause metabolic encephalopathy and cause confusion and trouble with memory.  Today she tells me that she lives alone, independent all actives of daily living, she was able to provide a clear history.  Her MMSE was 25 out of 30 which is in the normal range.  She does have macular degeneration, and experiences distortion of her vision, described as seeing bushes moving or the ground moving.   I did explain to the patient that since  she is lives alone and independent all ADLs, I do not suspect that she has dementia.  She will probably has mild cognitive impairment.  Plan for now is to continue current medications, continue follow-up with doctors including ophthalmology and return as needed.  She voiced understanding.   1. Mild cognitive impairment   2. Macular degeneration, unspecified laterality, unspecified type      Patient Instructions  Continue current medications Continue follow with your doctors Discussed ways of reducing the risk  of developing dementia including keeping a good health, good diet, good exercise plan.  Return as needed.   There are well-accepted and sensible ways to reduce risk for Alzheimers disease and other degenerative brain disorders .  Exercise Daily Walk A daily 20 minute walk should be part of your routine. Disease related apathy can be a significant roadblock to exercise and the only way to overcome this is to make it a daily routine and perhaps have a reward at the end (something your loved one loves to eat or drink perhaps) or a personal trainer coming to the home can also be very useful. Most importantly, the patient is much more likely to exercise if the caregiver / spouse does it with him/her. In general a structured, repetitive schedule is best.  General Health: Any diseases which effect your body will effect your brain such as a pneumonia, urinary infection, blood clot, heart attack or stroke. Keep contact with your primary care doctor for regular follow ups.  Sleep. A good nights sleep is healthy for the brain. Seven hours is recommended. If you have insomnia or poor sleep habits we can give you some instructions. If you have sleep apnea wear your mask.  Diet: Eating a heart healthy diet is also a good idea; fish and poultry instead of red meat, nuts (mostly non-peanuts), vegetables, fruits, olive oil or canola oil (instead of butter), minimal salt (use other spices to flavor foods),  whole grain rice, bread, cereal and pasta and wine in moderation.Research is now showing that the MIND diet, which is a combination of The Mediterranean diet and the DASH diet, is beneficial for cognitive processing and longevity. Information about this diet can be found in The MIND Diet, a book by Annitta Feeling, MS, RDN, and online at [employer]  Finances, Power of [address] and Advance Directives: You should consider putting legal safeguards in place with regard to financial and medical decision making. While the spouse always has power of attorney for medical and financial issues in the absence of any form, you should consider what you want in case the spouse / caregiver is no longer around or capable of making decisions.   No orders of the defined types were placed in this encounter.   No orders of the defined types were placed in this encounter.   Return if symptoms worsen or fail to improve.  I have spent a total of 65 minutes dedicated to this patient today, preparing to see patient, performing a medically appropriate examination and evaluation, ordering tests and/or medications and procedures, and counseling and educating the patient/family/caregiver; independently interpreting result and communicating results to the family/patient/caregiver; and documenting clinical information in the electronic medical record.   Pastor Falling, MD 05/16/2024, 4:46 PM  [facility] [address], [address] [place], [place] [address] [phone]

## 2024-05-17 ENCOUNTER — Inpatient Hospital Stay: Attending: Hematology and Oncology | Admitting: Hematology and Oncology

## 2024-05-17 VITALS — BP 134/72 | HR 60 | Temp 98.0°F | Resp 16 | Ht 65.0 in | Wt 148.2 lb

## 2024-05-17 DIAGNOSIS — Z79811 Long term (current) use of aromatase inhibitors: Secondary | ICD-10-CM | POA: Insufficient documentation

## 2024-05-17 DIAGNOSIS — Z87440 Personal history of urinary (tract) infections: Secondary | ICD-10-CM | POA: Insufficient documentation

## 2024-05-17 DIAGNOSIS — Z923 Personal history of irradiation: Secondary | ICD-10-CM | POA: Diagnosis not present

## 2024-05-17 DIAGNOSIS — Z87891 Personal history of nicotine dependence: Secondary | ICD-10-CM | POA: Diagnosis not present

## 2024-05-17 DIAGNOSIS — H3530 Unspecified macular degeneration: Secondary | ICD-10-CM | POA: Diagnosis not present

## 2024-05-17 DIAGNOSIS — Z170 Estrogen receptor positive status [ER+]: Secondary | ICD-10-CM | POA: Insufficient documentation

## 2024-05-17 DIAGNOSIS — C50412 Malignant neoplasm of upper-outer quadrant of left female breast: Secondary | ICD-10-CM | POA: Insufficient documentation

## 2024-05-17 NOTE — Progress Notes (Signed)
 Patient Care Team: Debrah Josette ORN., PA-C as PCP - General (Family Medicine) Belinda Cough, MD as Consulting Physician (General Surgery) Dewey Rush, MD as Consulting Physician (Radiation Oncology) [facility], [facility]  DIAGNOSIS:  Encounter Diagnosis  Name Primary?   Malignant neoplasm of upper-outer quadrant of left breast in female, estrogen receptor positive (HCC) Yes      CHIEF COMPLIANT:   HISTORY OF PRESENT ILLNESS: Discussed the use of AI scribe software for clinical note transcription with the patient, who gave verbal consent to proceed.  History of Present Illness Denise Strickland is a 89 year old female who presents with concerns about fluid accumulation post-mammogram.  She experiences fluid accumulation in the breast area following a mammogram, which is attributed to previous breast surgery. This results in slight soreness and firmness, particularly noticeable in the morning. She has been on anastrozole  for four years and four months, with eight months remaining until completion, and tolerates the medication well without any noticeable side effects.     ALLERGIES:  is allergic to aspirin, indomethacin, and pantoprazole.  MEDICATIONS:  Current Outpatient Medications  Medication Sig Dispense Refill   amLODipine  (NORVASC ) 5 MG tablet Take 1 tablet (5 mg total) by mouth daily. 30 tablet 0   anastrozole  (ARIMIDEX ) 1 MG tablet TAKE 1 TABLET EVERY DAY 90 tablet 3   ascorbic acid (VITAMIN C) 500 MG tablet Take 1 tablet by mouth daily.     Cholecalciferol 125 MCG (5000 UT) TABS Take 5,000 Units by mouth daily.     clonazePAM  (KLONOPIN ) 1 MG tablet Take 1 tablet by mouth at bedtime.     cyanocobalamin  (VITAMIN B12) 1000 MCG tablet Take 2 tablets (2,000 mcg total) by mouth daily.     ferrous sulfate 325 (65 FE) MG tablet Take 1 tablet by mouth daily with breakfast.     gemfibrozil  (LOPID ) 600 MG tablet Take 600 mg by mouth daily at 12 noon.     hydrochlorothiazide   (HYDRODIURIL ) 25 MG tablet Take 25 mg by mouth daily.     metFORMIN (GLUCOPHAGE-XR) 500 MG 24 hr tablet Take 500 mg by mouth daily.     Multiple Vitamins-Minerals (OCUVITE ADULT 50+ PO) Take 1 tablet by mouth daily.     polyethylene glycol (MIRALAX  / GLYCOLAX ) 17 g packet Take 17 g by mouth daily.     ramipril  (ALTACE ) 10 MG capsule Take 10 mg by mouth 2 (two) times daily.     Vitamin E 670 MG (1000 UT) CAPS Take 1,000 Units by mouth daily.     zinc gluconate 50 MG tablet Take 1 tablet by mouth daily.     No current facility-administered medications for this visit.    PHYSICAL EXAMINATION: ECOG PERFORMANCE STATUS: 1 - Symptomatic but completely ambulatory  Vitals:   05/17/24 0822  BP: 134/72  Pulse: 60  Resp: 16  Temp: 98 F (36.7 C)  SpO2: 98%   Filed Weights   05/17/24 0822  Weight: 148 lb 3.2 oz (67.2 kg)    Physical Exam   (exam performed in the presence of a chaperone)  LABORATORY DATA:  I have reviewed the data as listed    Latest Ref Rng & Units 02/10/2024    5:46 AM 02/09/2024   12:15 PM 02/09/2024   12:05 PM  CMP  Glucose 70 - 99 mg/dL 877   885   BUN 8 - 23 mg/dL 14   11   Creatinine 9.55 - 1.00 mg/dL 9.31   9.30  Sodium 135 - 145 mmol/L 136  137  136   Potassium 3.5 - 5.1 mmol/L 3.3  3.6  3.6   Chloride 98 - 111 mmol/L 102   101   CO2 22 - 32 mmol/L 24   27   Calcium 8.9 - 10.3 mg/dL 9.3   9.0   Total Protein 6.5 - 8.1 g/dL   6.0   Total Bilirubin 0.0 - 1.2 mg/dL   0.8   Alkaline Phos 38 - 126 U/L   36   AST 15 - 41 U/L   14   ALT 0 - 44 U/L   8     Lab Results  Component Value Date   WBC 5.3 02/10/2024   HGB 12.7 02/10/2024   HCT 37.8 02/10/2024   MCV 94.3 02/10/2024   PLT 240 02/10/2024   NEUTROABS 3.2 02/09/2024    ASSESSMENT & PLAN:  Malignant neoplasm of upper-outer quadrant of left breast in female, estrogen receptor positive (HCC) 02/14/2020: Left lumpectomy without sentinel lymph node sampling T1c N0 stage Ia grade 1 IDC, margins  clear with another surgery 02/29/2020 04/24/2020: Completed adjuvant radiation   Antiestrogen therapy: Tamoxifen  st also discussed that if the seroma makes it uncomfortable then could be referred to [facility] 06/05/2020 changed to anastrozole  due to intolerance to tamoxifen  Anastrozole  toxicities: None Recommend discontinuation of anastrozole  therapy on 01/24/2025   Breast cancer surveillance: Breast exam 05/17/2024: Benign, profound scar tissue in the left breast which impairs her ability to find anything. Mammogram and ultrasound 02/04/2024: [facility]: Benign, Breast density category B, 4.5 cm seroma   Mammogram discussion: I discussed with the patient of the findings of the mammogram and ultrasound did not indicate any concern for breast cancer.  Seroma can be drained if it is causing her symptoms.  At this time she is not having any symptoms and therefore it can be watched and monitored.    return to clinic on an as-needed basis   Assessment & Plan Malignant neoplasm of the upper-outer quadrant of the left breast, estrogen receptor positive On anastrozole  for over four years without significant side effects. Recent mammogram indicates fluid accumulation due to post-surgical changes, not malignancy. - Continue anastrozole  until January 24, 2025. - Monitor fluid-related symptoms; consider surgical drainage if discomfort increases. - Review mammogram results promptly and address concerns.  Macular degeneration Affects vision and driving ability.  Urinary tract infection UTI in April caused confusion and memory issues. Neurologist confirmed memory issues due to aging, not dementia.      No orders of the defined types were placed in this encounter.  The patient has a good understanding of the overall plan. she agrees with it. she will call with any problems that may develop before the next visit here. Total time spent: 30 mins including face to face time and time spent for planning, charting and  co-ordination of care   Naomi MARLA Chad, MD 05/17/24

## 2024-05-24 ENCOUNTER — Ambulatory Visit: Payer: Medicare HMO | Admitting: Hematology and Oncology

## 2024-06-07 ENCOUNTER — Ambulatory Visit: Admitting: Hematology and Oncology
